# Patient Record
Sex: Female | Born: 2000 | Race: Black or African American | Hispanic: No | Marital: Single | Smoking: Never smoker
Health system: Southern US, Community
[De-identification: ages and names within clinical notes are randomized; demographics above are authoritative.]

## PROBLEM LIST (undated history)

## (undated) HISTORY — PX: WISDOM TOOTH EXTRACTION: SHX21

---
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
## 2018-11-28 ENCOUNTER — Ambulatory Visit: Payer: Self-pay | Admitting: Family Medicine

## 2018-12-18 ENCOUNTER — Ambulatory Visit (INDEPENDENT_AMBULATORY_CARE_PROVIDER_SITE_OTHER): Payer: 59 | Admitting: Family Medicine

## 2018-12-18 ENCOUNTER — Encounter: Payer: Self-pay | Admitting: Family Medicine

## 2018-12-18 ENCOUNTER — Other Ambulatory Visit: Payer: Self-pay

## 2018-12-18 VITALS — BP 118/82 | HR 87 | Temp 98.3°F | Resp 16 | Ht 63.5 in | Wt 201.0 lb

## 2018-12-18 DIAGNOSIS — D239 Other benign neoplasm of skin, unspecified: Secondary | ICD-10-CM

## 2018-12-18 DIAGNOSIS — L700 Acne vulgaris: Secondary | ICD-10-CM | POA: Diagnosis not present

## 2018-12-18 DIAGNOSIS — F840 Autistic disorder: Secondary | ICD-10-CM | POA: Diagnosis not present

## 2018-12-18 DIAGNOSIS — F429 Obsessive-compulsive disorder, unspecified: Secondary | ICD-10-CM | POA: Diagnosis not present

## 2018-12-18 NOTE — Progress Notes (Signed)
Subjective  CC:  Chief Complaint  Patient presents with  . Establish Care    Switching from [place] in [facility], immunizations utd  . Autism    High functioning, parents want some guidance  . Spot on Leg    Left lower leg, seen dermatologist 1 yr ago,, Denies any changes to the area    HPI: Mandy Whitehead is a 18 y.o. female who presents to [facility] [place] today to establish care with me as a new patient.   She has the following concerns or needs:  18 yo female here with her dad: lives with parents; graduated HS in 2018 (home schooled). dxd with high functioning autism as teen (never formally tested). Overall, does well with ADLs and IADLS but doesn't drive. Not currently in school or work. Problems include social interaction, anxiety in public, OCD tendencies and some panic sxs. Never has needed medication. No social services or support groups. Parents are very supportive.   Due for cpe; imms all up to date except flu  Acne - not a concern for her now; routine skin care; has seen derm in past  Has lesion on left lower leg x > 1year. Was checked by derm last year. Hasn't changed. Not bothersome.   Assessment  1. Autism spectrum   2. Obsessive-compulsive disorder, unspecified type   3. Acne vulgaris   4. Dermatofibroma      Plan   Autism spectrum disorder: rec finding resources in community, [facility]. Discussed future goals: can she be independent? Consider parttime work with assistance, etc. Does well managing her anxiety behaviorally. Will monitor.   Dermatofibroma: reassured. No intervention recommended now.   Flu shot education given. Pt defers today. May get it later.   Follow up:  Return for complete physical. No orders of the defined types were placed in this encounter.  No orders of the defined types were placed in this encounter.    Depression screen [facility] 2/9 12/18/2018  Decreased Interest 0  Down, Depressed,  Hopeless 0  PHQ - 2 Score 0    We updated and reviewed the patient's past history in detail and it is documented below.  Patient Active Problem List   Diagnosis Date Noted  . Autism spectrum 12/18/2018  . Obsessive-compulsive disorder 12/18/2018  . Acne vulgaris 10/03/2016  . BMI (body mass index), pediatric, 95-99% for age 69/11/2016  . 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 INFLUENZA VACCINE  12/18/2019 (Originally 10/27/2018)  . HIV Screening  12/18/2019 (Originally 11/20/2013)  . TETANUS/TDAP  12/29/2020   Immunization History  Administered Date(s) Administered  . DTaP 01/18/1999, 04/07/1999, 06/04/1999, 08/04/2000, 06/11/2003  . HPV 9-valent 10/03/2016, 12/07/2016, 04/25/2017  . Hepatitis A 07/18/2006, 01/10/2007, 10/03/2016  . Hepatitis B 12/11/1998, 02/16/1999, 09/06/1999  . HiB (PRP-T) 01/18/1999, 04/07/1999, 06/04/1999, 04/12/2000  . IPV 01/18/1999, 04/07/1999, 08/04/2000, 06/11/2003  . MMR 04/12/2000, 06/11/2003  . Meningococcal Conjugate 12/30/2010, 07/17/2015  . Pneumococcal Conjugate-13 02/16/1999, 05/08/1999, 01/10/2000  . Tdap 12/30/2010  . Varicella 12/31/2002, 07/18/2006   Current Meds  Medication Sig  . Evening Primrose Oil 1000 MG CAPS Take 1 capsule by mouth daily.  . Multiple Vitamin (MULTIVITAMIN) capsule Take 1 capsule by mouth daily.  . vitamin C (ASCORBIC ACID) 500 MG tablet Take 500 mg by mouth daily.    Allergies: Patient is allergic to penicillin g. Past Medical History Patient  has no past medical history  on file. Past Surgical History Patient  has no past surgical history on file. Family History: Patient family history includes Arthritis in her maternal grandfather and maternal grandmother; Asthma in her paternal grandmother; Cancer in her paternal grandfather; Diabetes in her paternal grandmother; Heart disease in her maternal grandmother and paternal grandmother; High Cholesterol in her  paternal grandmother; High blood pressure in her father, maternal grandfather, and paternal grandmother; Kidney disease in her paternal grandmother; 64 / [place] in her mother. Social History:  Patient    Review of Systems: Constitutional: negative for fever or malaise Ophthalmic: negative for photophobia, double vision or loss of vision Cardiovascular: negative for chest pain, dyspnea on exertion, or new LE swelling Respiratory: negative for SOB or persistent cough Gastrointestinal: negative for abdominal pain, change in bowel habits or melena Genitourinary: negative for dysuria or gross hematuria Musculoskeletal: negative for new gait disturbance or muscular weakness Integumentary: negative for new or persistent rashes Neurological: negative for TIA or stroke symptoms Psychiatric: negative for SI or delusions Allergic/Immunologic: negative for hives  Patient Care Team    Relationship Specialty Notifications Start End  Leamon Arnt, MD PCP - General Family Medicine  12/18/18     Objective  Vitals: BP 118/82   Pulse 87   Temp 98.3 F (36.8 C) (Tympanic)   Resp 16   Ht 5' 3.5" (1.613 m)   Wt 201 lb (91.2 kg)   LMP 12/17/2018   SpO2 98%   BMI 35.05 kg/m  General:  Well developed, well nourished, no acute distress  Psych:  Alert and oriented,normal mood and affect HEENT:  Normocephalic, atraumatic, non-icteric sclera, PERRL, oropharynx is without mass or exudate, supple neck without adenopathy, mass or thyromegaly Cardiovascular:  RRR without gallop, rub or murmur, nondisplaced PMI Respiratory:  Good breath sounds bilaterally, CTAB with normal respiratory effort Gastrointestinal: normal bowel sounds, soft, non-tender, no noted masses. No HSM MSK: no deformities, contusions. Joints are without erythema or swelling Skin:  Warm, no rashes or suspicious lesions noted Neurologic:    Mental status is normal. Gross motor and sensory exams are normal. Normal gait    Commons side effects, risks, benefits, and alternatives for medications and treatment plan prescribed today were discussed, and the patient expressed understanding of the given instructions. Patient is instructed to call or message via MyChart if he/she has any questions or concerns regarding our treatment plan. No barriers to understanding were identified. We discussed Red Flag symptoms and signs in detail. Patient expressed understanding regarding what to do in case of urgent or emergency type symptoms.   Medication list was reconciled, printed and provided to the patient in AVS. Patient instructions and summary information was reviewed with the patient as documented in the AVS. This note was prepared with assistance of Dragon voice recognition software. Occasional wrong-word or sound-a-like substitutions may have occurred due to the inherent limitations of voice recognition software

## 2018-12-18 NOTE — Patient Instructions (Signed)
Please return when you'd like to have your annual complete physical; please come fasting. Please consider getting your flu shot!  It was a pleasure meeting you today! Thank you for choosing [place] to meet your healthcare needs! I truly look forward to working with you. If you have any questions or concerns, please send me a message via Mychart or call the office at [phone].

## 2019-07-05 ENCOUNTER — Ambulatory Visit: Payer: 59 | Attending: Internal Medicine

## 2019-07-05 DIAGNOSIS — Z23 Encounter for immunization: Secondary | ICD-10-CM

## 2019-07-05 NOTE — Progress Notes (Signed)
   Covid-19 Vaccination Clinic  Name:  Mandy Whitehead    MRN: [id number] DOB: December 27, 2000  07/05/2019  [name] was observed post Covid-19 immunization for 15 minutes without incident. She was provided with Vaccine Information Sheet and instruction to access the V-Safe system.   [name] was instructed to call 911 with any severe reactions post vaccine: Marland Kitchen Difficulty breathing  . Swelling of face and throat  . A fast heartbeat  . A bad rash all over body  . Dizziness and weakness   Immunizations Administered    Name Date Dose VIS Date Route   Pfizer COVID-19 Vaccine 07/05/2019  1:05 PM 0.3 mL 03/08/2019 Intramuscular   Manufacturer: [place]   Lot: [id number]   [place]: [id number]

## 2019-07-10 ENCOUNTER — Ambulatory Visit (INDEPENDENT_AMBULATORY_CARE_PROVIDER_SITE_OTHER): Payer: 59 | Admitting: Family Medicine

## 2019-07-10 ENCOUNTER — Ambulatory Visit (INDEPENDENT_AMBULATORY_CARE_PROVIDER_SITE_OTHER): Payer: 59

## 2019-07-10 ENCOUNTER — Encounter: Payer: Self-pay | Admitting: Family Medicine

## 2019-07-10 ENCOUNTER — Other Ambulatory Visit: Payer: Self-pay

## 2019-07-10 VITALS — BP 108/58 | HR 98 | Temp 98.3°F | Resp 14 | Ht 63.5 in | Wt 203.8 lb

## 2019-07-10 DIAGNOSIS — F5105 Insomnia due to other mental disorder: Secondary | ICD-10-CM

## 2019-07-10 DIAGNOSIS — M545 Low back pain, unspecified: Secondary | ICD-10-CM

## 2019-07-10 DIAGNOSIS — F840 Autistic disorder: Secondary | ICD-10-CM

## 2019-07-10 DIAGNOSIS — L700 Acne vulgaris: Secondary | ICD-10-CM | POA: Diagnosis not present

## 2019-07-10 DIAGNOSIS — H9193 Unspecified hearing loss, bilateral: Secondary | ICD-10-CM | POA: Diagnosis not present

## 2019-07-10 DIAGNOSIS — J301 Allergic rhinitis due to pollen: Secondary | ICD-10-CM | POA: Diagnosis not present

## 2019-07-10 DIAGNOSIS — F99 Mental disorder, not otherwise specified: Secondary | ICD-10-CM

## 2019-07-10 DIAGNOSIS — F5089 Other specified eating disorder: Secondary | ICD-10-CM

## 2019-07-10 DIAGNOSIS — L680 Hirsutism: Secondary | ICD-10-CM

## 2019-07-10 DIAGNOSIS — Z0000 Encounter for general adult medical examination without abnormal findings: Secondary | ICD-10-CM | POA: Diagnosis not present

## 2019-07-10 LAB — COMPREHENSIVE METABOLIC PANEL
ALT: 10 U/L (ref 0–35)
AST: 12 U/L (ref 0–37)
Albumin: 3.9 g/dL (ref 3.5–5.2)
Alkaline Phosphatase: 86 U/L (ref 39–117)
BUN: 8 mg/dL (ref 6–23)
CO2: 27 mEq/L (ref 19–32)
Calcium: 9 mg/dL (ref 8.4–10.5)
Chloride: 105 mEq/L (ref 96–112)
Creatinine, Ser: 0.69 mg/dL (ref 0.40–1.20)
GFR: 130.42 mL/min (ref >60.00)
Glucose, Bld: 93 mg/dL (ref 70–99)
Potassium: 4.1 mEq/L (ref 3.5–5.1)
Sodium: 138 mEq/L (ref 135–145)
Total Bilirubin: 0.3 mg/dL (ref 0.2–1.2)
Total Protein: 6.9 g/dL (ref 6.0–8.3)

## 2019-07-10 LAB — CBC WITH DIFFERENTIAL/PLATELET
Basophils Absolute: 0.1 10*3/uL (ref 0.0–0.1)
Basophils Relative: 0.8 % (ref 0.0–3.0)
Eosinophils Absolute: 0.1 10*3/uL (ref 0.0–0.7)
Eosinophils Relative: 1.2 % (ref 0.0–5.0)
HCT: 30.2 % — ABNORMAL LOW (ref 36.0–46.0)
Hemoglobin: 9.7 g/dL — ABNORMAL LOW (ref 12.0–15.0)
Lymphocytes Relative: 24.5 % (ref 12.0–46.0)
Lymphs Abs: 1.7 10*3/uL (ref 0.7–4.0)
MCHC: 32 g/dL (ref 30.0–36.0)
MCV: 75.1 fl — ABNORMAL LOW (ref 78.0–100.0)
Monocytes Absolute: 0.4 10*3/uL (ref 0.1–1.0)
Monocytes Relative: 5.3 % (ref 3.0–12.0)
Neutro Abs: 4.6 10*3/uL (ref 1.4–7.7)
Neutrophils Relative %: 68.2 % (ref 43.0–77.0)
Platelets: 330 10*3/uL (ref 150.0–400.0)
RBC: 4.02 Mil/uL (ref 3.87–5.11)
RDW: 16.8 % — ABNORMAL HIGH (ref 11.5–14.6)
WBC: 6.8 10*3/uL (ref 4.5–10.5)

## 2019-07-10 LAB — TSH: TSH: 1.83 u[IU]/mL (ref 0.35–5.50)

## 2019-07-10 MED ORDER — HYDROXYZINE HCL 10 MG PO TABS: 10.0000 mg | ORAL_TABLET | Freq: Every evening | ORAL | 2 refills | Status: DC | PRN

## 2019-07-10 MED ORDER — FLUTICASONE PROPIONATE 50 MCG/ACT NA SUSP: 1.0000 | Freq: Every day | NASAL | 6 refills | Status: DC

## 2019-07-10 NOTE — Patient Instructions (Signed)
Please follow up if symptoms do not improve or as needed.   I will release your lab results to you on your MyChart account with further instructions. Please reply with any questions.   If you have any questions or concerns, please don't hesitate to send me a message via MyChart or call the office at [phone]. Thank you for visiting with [place] today! It's our pleasure caring for you.  Start flonase daily to help with your allergy and ear symptoms.  Talk with your dermatologist about your acne treatments and hair growth.   Try the hydroxyzine to help you sleep.    Preventive Care 18-44 Years Old, Female Preventive care refers to visits with your health care provider and lifestyle choices that can promote health and wellness. This includes:  A yearly physical exam. This may also be called an annual well check.  Regular dental visits and eye exams.  Immunizations.  Screening for certain conditions.  Healthy lifestyle choices, such as eating a healthy diet, getting regular exercise, not using drugs or products that contain nicotine and tobacco, and limiting alcohol use. What can I expect for my preventive care visit? Physical exam Your health care provider will check your:  Height and weight. This may be used to calculate body mass index (BMI), which tells if you are at a healthy weight.  Heart rate and blood pressure.  Skin for abnormal spots. Counseling Your health care provider may ask you questions about your:  Alcohol, tobacco, and drug use.  Emotional well-being.  Home and relationship well-being.  Sexual activity.  Eating habits.  Work and work Statistician.  Method of birth control.  Menstrual cycle.  Pregnancy history. What immunizations do I need?  Influenza (flu) vaccine  This is recommended every year. Tetanus, diphtheria, and pertussis (Tdap) vaccine  You may need a Td booster every 10 years. Varicella (chickenpox) vaccine  You may need this if  you have not been vaccinated. Human papillomavirus (HPV) vaccine  If recommended by your health care provider, you may need three doses over 6 months. Measles, mumps, and rubella (MMR) vaccine  You may need at least one dose of MMR. You may also need a second dose. Meningococcal conjugate (MenACWY) vaccine  One dose is recommended if you are age 55-21 years and a first-year college student living in a residence hall, or if you have one of several medical conditions. You may also need additional booster doses. Pneumococcal conjugate (PCV13) vaccine  You may need this if you have certain conditions and were not previously vaccinated. Pneumococcal polysaccharide (PPSV23) vaccine  You may need one or two doses if you smoke cigarettes or if you have certain conditions. Hepatitis A vaccine  You may need this if you have certain conditions or if you travel or work in places where you may be exposed to hepatitis A. Hepatitis B vaccine  You may need this if you have certain conditions or if you travel or work in places where you may be exposed to hepatitis B. Haemophilus influenzae type b (Hib) vaccine  You may need this if you have certain conditions. You may receive vaccines as individual doses or as more than one vaccine together in one shot (combination vaccines). Talk with your health care provider about the risks and benefits of combination vaccines. What tests do I need?  Blood tests  Lipid and cholesterol levels. These may be checked every 5 years starting at age 75.  Hepatitis C test.  Hepatitis B test. Screening  Diabetes screening. This is done by checking your blood sugar (glucose) after you have not eaten for a while (fasting).  Sexually transmitted disease (STD) testing.  BRCA-related cancer screening. This may be done if you have a family history of breast, ovarian, tubal, or peritoneal cancers.  Pelvic exam and Pap test. This may be done every 3 years starting at age  62. Starting at age 68, this may be done every 5 years if you have a Pap test in combination with an HPV test. Talk with your health care provider about your test results, treatment options, and if necessary, the need for more tests. Follow these instructions at home: Eating and drinking   Eat a diet that includes fresh fruits and vegetables, whole grains, lean protein, and low-fat dairy.  Take vitamin and mineral supplements as recommended by your health care provider.  Do not drink alcohol if: ? Your health care provider tells you not to drink. ? You are pregnant, may be pregnant, or are planning to become pregnant.  If you drink alcohol: ? Limit how much you have to 0-1 drink a day. ? Be aware of how much alcohol is in your drink. In the [place] one drink equals one 12 oz bottle of beer (355 mL), one 5 oz glass of wine (148 mL), or one 1 oz glass of hard liquor (44 mL). Lifestyle  Take daily care of your teeth and gums.  Stay active. Exercise for at least 30 minutes on 5 or more days each week.  Do not use any products that contain nicotine or tobacco, such as cigarettes, e-cigarettes, and chewing tobacco. If you need help quitting, ask your health care provider.  If you are sexually active, practice safe sex. Use a condom or other form of birth control (contraception) in order to prevent pregnancy and STIs (sexually transmitted infections). If you plan to become pregnant, see your health care provider for a preconception visit. What's next?  Visit your health care provider once a year for a well check visit.  Ask your health care provider how often you should have your eyes and teeth checked.  Stay up to date on all vaccines. This information is not intended to replace advice given to you by your health care provider. Make sure you discuss any questions you have with your health care provider. Document Revised: 11/23/2017 Document Reviewed: 11/23/2017 Elsevier Patient Education   2020 [employer].

## 2019-07-10 NOTE — Progress Notes (Signed)
Subjective  Chief Complaint  Patient presents with  . Annual Exam    feet and back pain, cravings to eat hair  . Hearing Problem    muffled feeling after having wisdom teeth removed 03/2019  . Acne    has seen dermatology, not pleased with results  . Insomnia    issues falling asleep    HPI: Mandy Whitehead is a 19 y.o. female who presents to [place] at [place] today for a Female Wellness Visit.   Wellness Visit: annual visit with health maintenance review and exam without Pap   HM: 19 yo with autism spectrum disorder doing well for cpe today. Has several concerns.  Acne: facial mostly. Has derm. Gave topicals that werent very helpful. Hasn't been back  C/o muffled hearing L>R ear x several months. + sneezing and some nasal congestion. Untreated. No ear pain. Thinks it is due to wax. Uses q tips  Has unwanted excessive hair growth: chin: has to shave, chest, back, hands, legs. Father and his side of the family have "lots of hair". No change or deepening in voice. Reports regular menses.   Anxiety: chews hair more to calm herself. No ice or dirt pica  Trouble sleeping mostly dueto worry. No depressive sxs  Mild back pain at times. tyelnol helps. ? Curve in spine. Peds said she was "malaligned" in past.   Assessment  1. Annual physical exam   2. Autism spectrum   3. Acne vulgaris   4. Seasonal allergic rhinitis due to pollen   5. Hearing difficulty of both ears   6. Hirsutism   7. 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 Midline low back pain without sciatica, unspecified chronicity      Plan  Female Wellness Visit:  Age appropriate Health Maintenance and Prevention measures were discussed with patient. Included topics are cancer screening recommendations, ways to keep healthy (see AVS) including dietary and exercise recommendations, regular eye and dental care, use of seat belts, avoidance of tobacco use.   BMI: discussed patient's BMI  and encouraged positive lifestyle modifications to help get to or maintain a target BMI.  HM needs and immunizations were addressed and ordered. See below for orders. See HM and immunization section for updates.  Routine labs and screening tests ordered including cmp, cbc and lipids where appropriate.  Discussed recommendations regarding Vit D and calcium supplementation (see AVS)  Acute problems also addressed due to need in addition to cpe:  Acne: rec f/u with derm for next level of treatment.   Acne/hirusitism: check androgen levels, fsh/lh. Consider spironolactone. Discuss with derm. Sounds familial but need to look into other causes. No other signs of virilization.   Pica: more related to anxiety but r/o anemia and iron deficiency given heavy menstrual cycles.   Insomnia due to anxiety: start hydroxyzine.   Back pain with scoliosis on exam. Check xrays. Discussed stretches and prn tylenol.   Follow up: Return in about 1 year (around 07/09/2020) for complete physical.   Orders Placed This Encounter  Procedures  . DG Thoracic Spine 2 View  . DG Lumbar Spine 2-3 Views  . CBC with Differential/Platelet  . Iron, TIBC and Ferritin Panel  . TSH  . Comprehensive metabolic panel  . FSH/LH  . DHEA-sulfate  . Testosterone Total,Free,Bio, Males   Meds ordered this encounter  Medications  . hydrOXYzine (ATARAX/VISTARIL) 10 MG tablet    Sig: Take 1-2 tablets (10-20 mg total) by  mouth at bedtime as needed.    Dispense:  60 tablet    Refill:  2  . fluticasone (FLONASE) 50 MCG/ACT nasal spray    Sig: Place 1 spray into both nostrils daily.    Dispense:  16 g    Refill:  6      Lifestyle: Body mass index is 35.54 kg/m. Wt Readings from Last 3 Encounters:  07/10/19 203 lb 12.8 oz (92.4 kg)  12/18/18 201 lb (91.2 kg)     Patient Active Problem List   Diagnosis Date Noted  . Hirsutism 07/10/2019  . Seasonal allergic rhinitis due to pollen 07/10/2019  . Autism spectrum  12/18/2018  . Obsessive-compulsive disorder 12/18/2018  . Acne vulgaris 10/03/2016  . BMI (body mass index), pediatric, 95-99% for age 44/11/2016  . 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 HIV Screening  12/18/2019 (Originally 11/20/2013)  . INFLUENZA VACCINE  10/27/2019  . TETANUS/TDAP  12/29/2020   Immunization History  Administered Date(s) Administered  . DTaP 01/18/1999, 04/07/1999, 06/04/1999, 08/04/2000, 06/11/2003  . HPV 9-valent 10/03/2016, 12/07/2016, 04/25/2017  . Hepatitis A 07/18/2006, 01/10/2007, 10/03/2016  . Hepatitis B 12/11/1998, 02/16/1999, 09/06/1999  . HiB (PRP-T) 01/18/1999, 04/07/1999, 06/04/1999, 04/12/2000  . IPV 01/18/1999, 04/07/1999, 08/04/2000, 06/11/2003  . MMR 04/12/2000, 06/11/2003  . Meningococcal Conjugate 12/30/2010, 07/17/2015  . PFIZER SARS-COV-2 Vaccination 07/05/2019  . Pneumococcal Conjugate-13 02/16/1999, 05/08/1999, 01/10/2000  . Tdap 12/30/2010  . Varicella 12/31/2002, 07/18/2006   We updated and reviewed the patient's past history in detail and it is documented below. Allergies: Patient is allergic to penicillin g. Past Medical History Patient  has no past medical history on file. Past Surgical History Patient  has no past surgical history on file. Family History: Patient family history includes Arthritis in her maternal grandfather and maternal grandmother; Asthma in her paternal grandmother; Cancer in her paternal grandfather; Diabetes in her paternal grandmother; Heart disease in her maternal grandmother and paternal grandmother; High Cholesterol in her paternal grandmother; High blood pressure in her father, maternal grandfather, and paternal grandmother; Kidney disease in her paternal grandmother; 72 / [place] in her mother. Social History:  Patient  reports that she has never smoked. She has never used smokeless tobacco. She reports that she does not drink alcohol or  use drugs.  Review of Systems: Constitutional: negative for fever or malaise Ophthalmic: negative for photophobia, double vision or loss of vision Cardiovascular: negative for chest pain, dyspnea on exertion, or new LE swelling Respiratory: negative for SOB or persistent cough Gastrointestinal: negative for abdominal pain, change in bowel habits or melena Genitourinary: negative for dysuria or gross hematuria, no abnormal uterine bleeding or disharge Musculoskeletal: negative for new gait disturbance or muscular weakness Integumentary: negative for new or persistent rashes, no breast lumps Neurological: negative for TIA or stroke symptoms Psychiatric: negative for SI or delusions Allergic/Immunologic: negative for hives Patient Care Team    Relationship Specialty Notifications Start End  Leamon Arnt, MD PCP - General Family Medicine  12/18/18     Objective  Vitals: BP (!) 108/58   Pulse 98   Temp 98.3 F (36.8 C) (Temporal)   Resp 14   Ht 5' 3.5" (1.613 m)   Wt 203 lb 12.8 oz (92.4 kg)   LMP 07/06/2019   SpO2 99%   BMI 35.54 kg/m  General:  Well developed, well nourished, no acute distress  Psych:  Alert and orientedx3,normal mood and affect HEENT:  Normocephalic, atraumatic, non-icteric  sclera, supple neck without adenopathy, mass or thyromegaly Cardiovascular:  Normal S1, S2, RRR without gallop, rub or murmur Respiratory:  Good breath sounds bilaterally, CTAB with normal respiratory effort Gastrointestinal: normal bowel sounds, soft, non-tender, no noted masses. No HSM Skin:  Warm, no rashes or suspicious lesions noted, acne on face. hirsutism present Neurologic:    Mental status is normal.No tremor Back: thoracic curve present with assymetry upon flexion. Nontender.    Commons side effects, risks, benefits, and alternatives for medications and treatment plan prescribed today were discussed, and the patient expressed understanding of the given instructions. Patient is  instructed to call or message via MyChart if he/she has any questions or concerns regarding our treatment plan. No barriers to understanding were identified. We discussed Red Flag symptoms and signs in detail. Patient expressed understanding regarding what to do in case of urgent or emergency type symptoms.   Medication list was reconciled, printed and provided to the patient in AVS. Patient instructions and summary information was reviewed with the patient as documented in the AVS. This note was prepared with assistance of Dragon voice recognition software. Occasional wrong-word or sound-a-like substitutions may have occurred due to the inherent limitations of voice recognition software  This visit occurred during the SARS-CoV-2 public health emergency.  Safety protocols were in place, including screening questions prior to the visit, additional usage of staff PPE, and extensive cleaning of exam room while observing appropriate contact time as indicated for disinfecting solutions.

## 2019-07-11 ENCOUNTER — Encounter: Payer: Self-pay | Admitting: Family Medicine

## 2019-07-11 DIAGNOSIS — M419 Scoliosis, unspecified: Secondary | ICD-10-CM | POA: Insufficient documentation

## 2019-07-13 LAB — IRON,TIBC AND FERRITIN PANEL
%SAT: 6 % (calc) — ABNORMAL LOW (ref 16–45)
Ferritin: 5 ng/mL — ABNORMAL LOW (ref 16–154)
Iron: 23 ug/dL — ABNORMAL LOW (ref 40–190)
TIBC: 392 mcg/dL (calc) (ref 250–450)

## 2019-07-13 LAB — CP TESTOSTERONE, BIO-FEMALE/CHILDREN
Albumin: 3.9 g/dL (ref 3.6–5.1)
Sex Hormone Binding: 22 nmol/L (ref 17–124)
TESTOSTERONE, BIOAVAILABLE: 5.8 ng/dL (ref 0.5–8.5)
Testosterone, Free: 3.3 pg/mL (ref 0.2–5.0)

## 2019-07-13 LAB — DHEA-SULFATE: DHEA-SO4: 183 ug/dL (ref 51–321)

## 2019-07-13 LAB — FSH/LH
FSH: 8.8 m[IU]/mL
LH: 4.2 m[IU]/mL

## 2019-07-15 ENCOUNTER — Encounter: Payer: Self-pay | Admitting: Family Medicine

## 2019-07-15 DIAGNOSIS — D500 Iron deficiency anemia secondary to blood loss (chronic): Secondary | ICD-10-CM | POA: Insufficient documentation

## 2019-07-17 ENCOUNTER — Telehealth: Payer: Self-pay | Admitting: Family Medicine

## 2019-07-17 NOTE — Telephone Encounter (Signed)
Pt mother called requesting lab results. Please advise.

## 2019-07-18 NOTE — Telephone Encounter (Signed)
LMOVM advising patient to call back for lab results

## 2019-07-31 ENCOUNTER — Ambulatory Visit: Payer: 59 | Attending: Internal Medicine

## 2019-07-31 DIAGNOSIS — Z23 Encounter for immunization: Secondary | ICD-10-CM

## 2019-07-31 NOTE — Progress Notes (Signed)
   Covid-19 Vaccination Clinic  Name:  Mandy Whitehead    MRN: [id number] DOB: [birth year]  07/31/2019  [name] was observed post Covid-19 immunization for    Covid-19 Vaccination Clinic  Name:  Mandy Whitehead    MRN: [id number] DOB: 03/25/2001  07/31/2019  [name] was observed post Covid-19 immunization for 15 minutes without incident. She was provided with Vaccine Information Sheet and instruction to access the V-Safe system.   [name] was instructed to call 911 with any severe reactions post vaccine: Marland Kitchen Difficulty breathing  . Swelling of face and throat  . A fast heartbeat  . A bad rash all over body  . Dizziness and weakness   Immunizations Administered    Name Date Dose VIS Date Route   Pfizer COVID-19 Vaccine 07/31/2019  9:50 AM 0.3 mL 05/22/2018 Intramuscular   Manufacturer: [place]   Lot: [id number]   [place]: [id number]     without incident. She was provided with Vaccine Information Sheet and instruction to access the V-Safe system.   [name] was instructed to call 911 with any severe reactions post vaccine: Marland Kitchen Difficulty breathing  . Swelling of face and throat  . A fast heartbeat  . A bad rash all over body  . Dizziness and weakness   Immunizations Administered    Name Date Dose VIS Date [place] COVID-19 Vaccine 07/31/2019  9:50 AM 0.3 mL 05/22/2018 Intramuscular   Manufacturer: [place]   Lot: [id number]   [place]: [id number]

## 2020-01-06 ENCOUNTER — Other Ambulatory Visit: Payer: Self-pay

## 2020-01-06 MED ORDER — FLUTICASONE PROPIONATE 50 MCG/ACT NA SUSP: 1.0000 | Freq: Every day | NASAL | 6 refills | Status: DC

## 2020-02-12 ENCOUNTER — Encounter: Payer: Self-pay | Admitting: Family Medicine

## 2020-02-13 NOTE — Telephone Encounter (Signed)
Please print out forms and complete, then give to me. Add charge forms I'll place referral. Please respond I'll work on this for her.  Thanks.

## 2020-03-14 ENCOUNTER — Ambulatory Visit: Payer: 59 | Attending: Internal Medicine

## 2020-03-14 DIAGNOSIS — Z23 Encounter for immunization: Secondary | ICD-10-CM

## 2020-03-14 NOTE — Progress Notes (Signed)
   Covid-19 Vaccination Clinic  Name:  Izella Ybanez    MRN: [id number] DOB: [birth year]  03/14/2020  Ms. Mccalip was observed post Covid-19 immunization for 15 minutes without incident. She was provided with Vaccine Information Sheet and instruction to access the V-Safe system.   Ms. Geddes was instructed to call 911 with any severe reactions post vaccine: Marland Kitchen Difficulty breathing  . Swelling of face and throat  . A fast heartbeat  . A bad rash all over body  . Dizziness and weakness   Immunizations Administered    Name Date Dose VIS Date Route   Pfizer COVID-19 Vaccine 03/14/2020 10:51 AM 0.3 mL 01/15/2020 Intramuscular   Manufacturer: [place]   Lot: [id number]   [place]: [id number]

## 2020-05-26 ENCOUNTER — Ambulatory Visit (INDEPENDENT_AMBULATORY_CARE_PROVIDER_SITE_OTHER): Payer: 59 | Admitting: Clinical

## 2020-05-26 DIAGNOSIS — F89 Unspecified disorder of psychological development: Secondary | ICD-10-CM | POA: Diagnosis not present

## 2020-05-28 ENCOUNTER — Encounter: Payer: Self-pay | Admitting: Family Medicine

## 2020-06-03 ENCOUNTER — Ambulatory Visit (INDEPENDENT_AMBULATORY_CARE_PROVIDER_SITE_OTHER): Payer: 59 | Admitting: Clinical

## 2020-06-03 DIAGNOSIS — F89 Unspecified disorder of psychological development: Secondary | ICD-10-CM

## 2020-06-19 ENCOUNTER — Ambulatory Visit: Payer: 59 | Admitting: Clinical

## 2020-06-19 ENCOUNTER — Other Ambulatory Visit: Payer: Self-pay

## 2020-07-07 ENCOUNTER — Ambulatory Visit (INDEPENDENT_AMBULATORY_CARE_PROVIDER_SITE_OTHER): Payer: 59 | Admitting: Clinical

## 2020-07-07 DIAGNOSIS — F840 Autistic disorder: Secondary | ICD-10-CM | POA: Diagnosis not present

## 2020-07-15 ENCOUNTER — Encounter: Payer: 59 | Admitting: Family Medicine

## 2020-10-20 ENCOUNTER — Encounter: Payer: Self-pay | Admitting: Family Medicine

## 2020-11-20 ENCOUNTER — Encounter: Payer: Self-pay | Admitting: Family Medicine

## 2020-12-02 ENCOUNTER — Ambulatory Visit (INDEPENDENT_AMBULATORY_CARE_PROVIDER_SITE_OTHER): Payer: 59 | Admitting: Clinical

## 2020-12-02 DIAGNOSIS — F419 Anxiety disorder, unspecified: Secondary | ICD-10-CM | POA: Diagnosis not present

## 2020-12-02 DIAGNOSIS — F331 Major depressive disorder, recurrent, moderate: Secondary | ICD-10-CM | POA: Diagnosis not present

## 2020-12-02 DIAGNOSIS — F840 Autistic disorder: Secondary | ICD-10-CM | POA: Diagnosis not present

## 2020-12-23 ENCOUNTER — Ambulatory Visit (INDEPENDENT_AMBULATORY_CARE_PROVIDER_SITE_OTHER): Payer: 59 | Admitting: Clinical

## 2020-12-23 DIAGNOSIS — F840 Autistic disorder: Secondary | ICD-10-CM

## 2020-12-29 ENCOUNTER — Ambulatory Visit (INDEPENDENT_AMBULATORY_CARE_PROVIDER_SITE_OTHER): Payer: 59 | Admitting: Clinical

## 2020-12-29 DIAGNOSIS — F840 Autistic disorder: Secondary | ICD-10-CM

## 2020-12-30 IMAGING — DX DG THORACIC SPINE 2V
3 series · 3 of 3 positions shown · non-contrast
Comparison: None.

CLINICAL DATA: Scoliosis evaluation

EXAM:
THORACIC SPINE 2 VIEWS; LUMBAR SPINE - 2-3 VIEW

[thoracic spine ap]
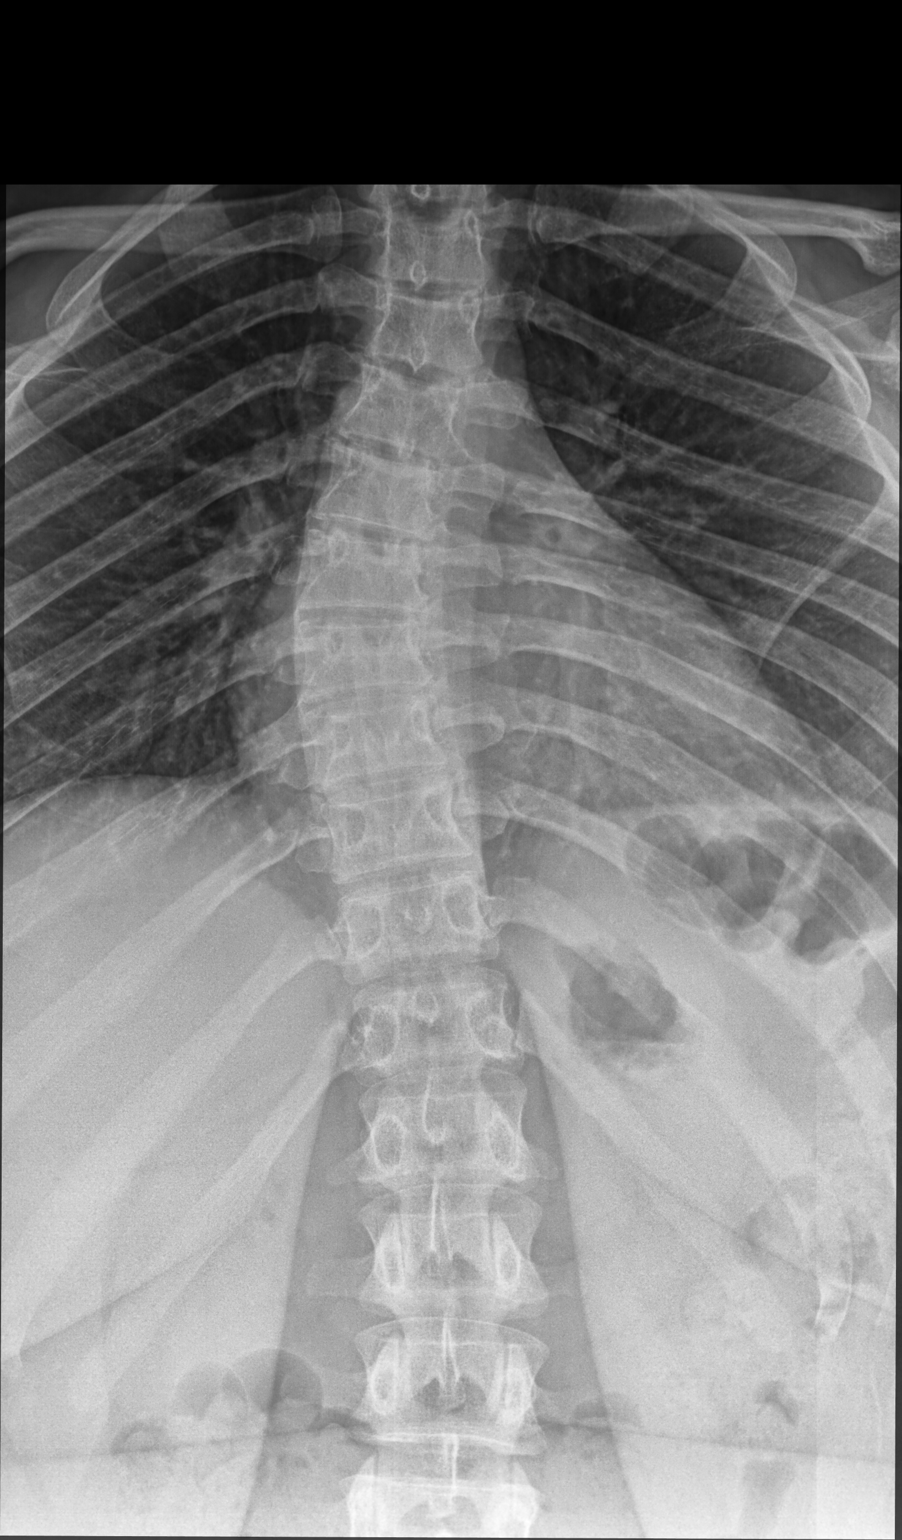

[thoracic spine lat (1 of 2)]
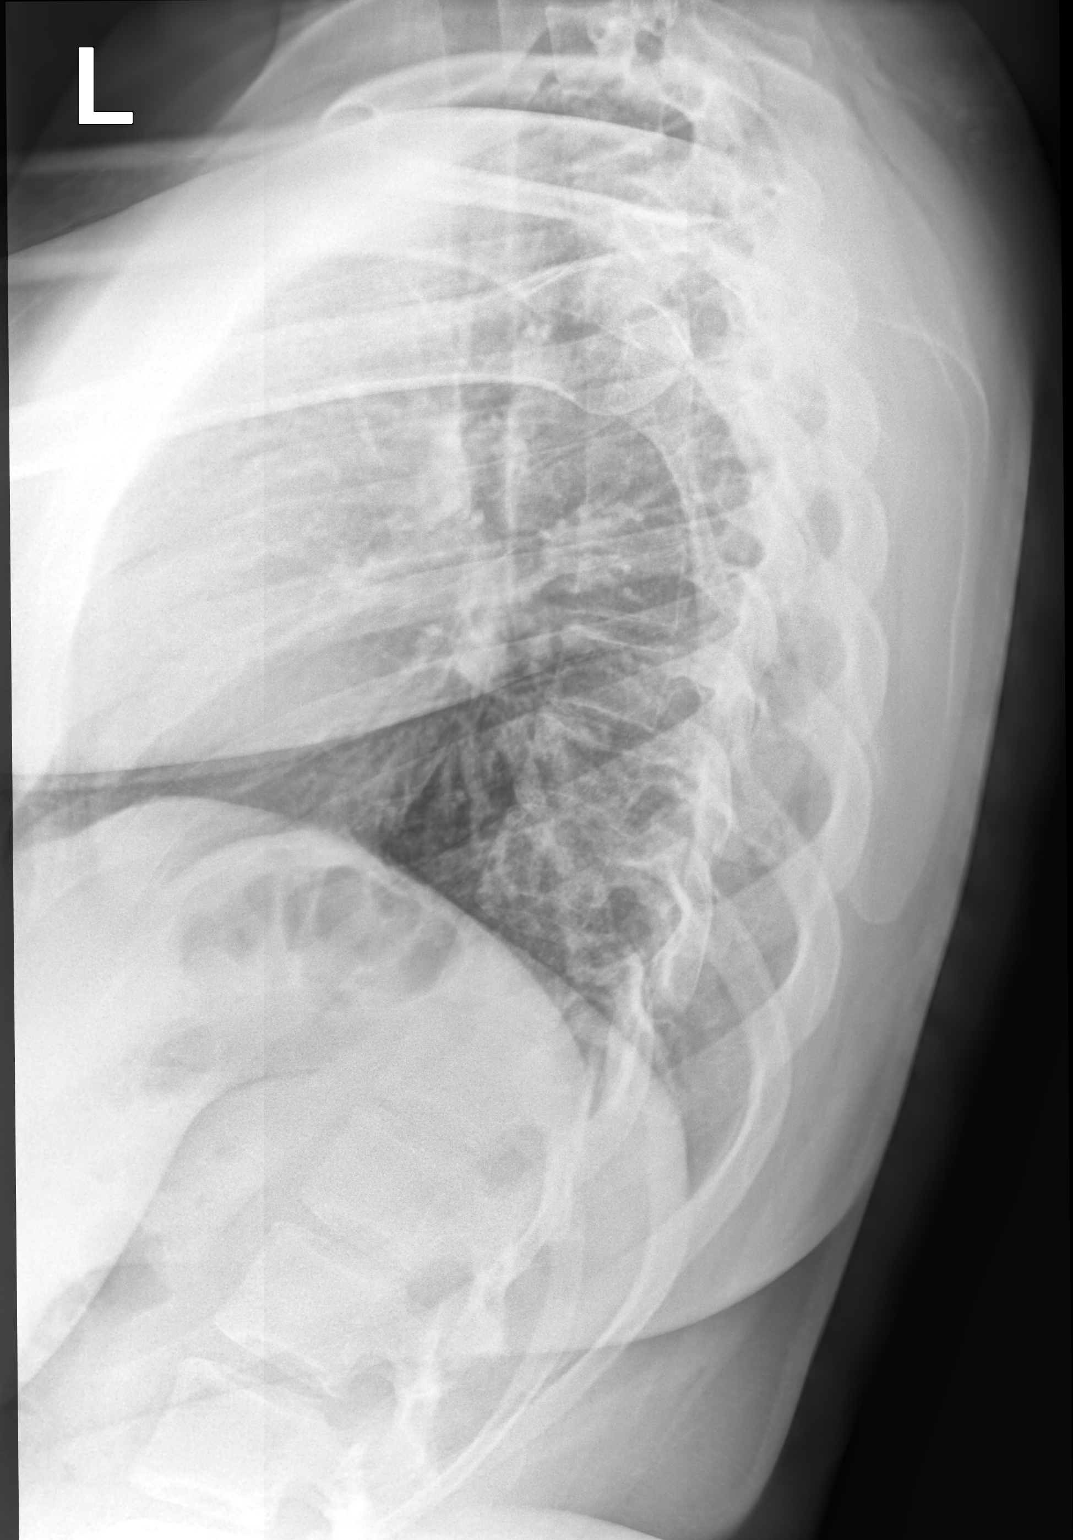

[thoracic spine lat (2 of 2)]
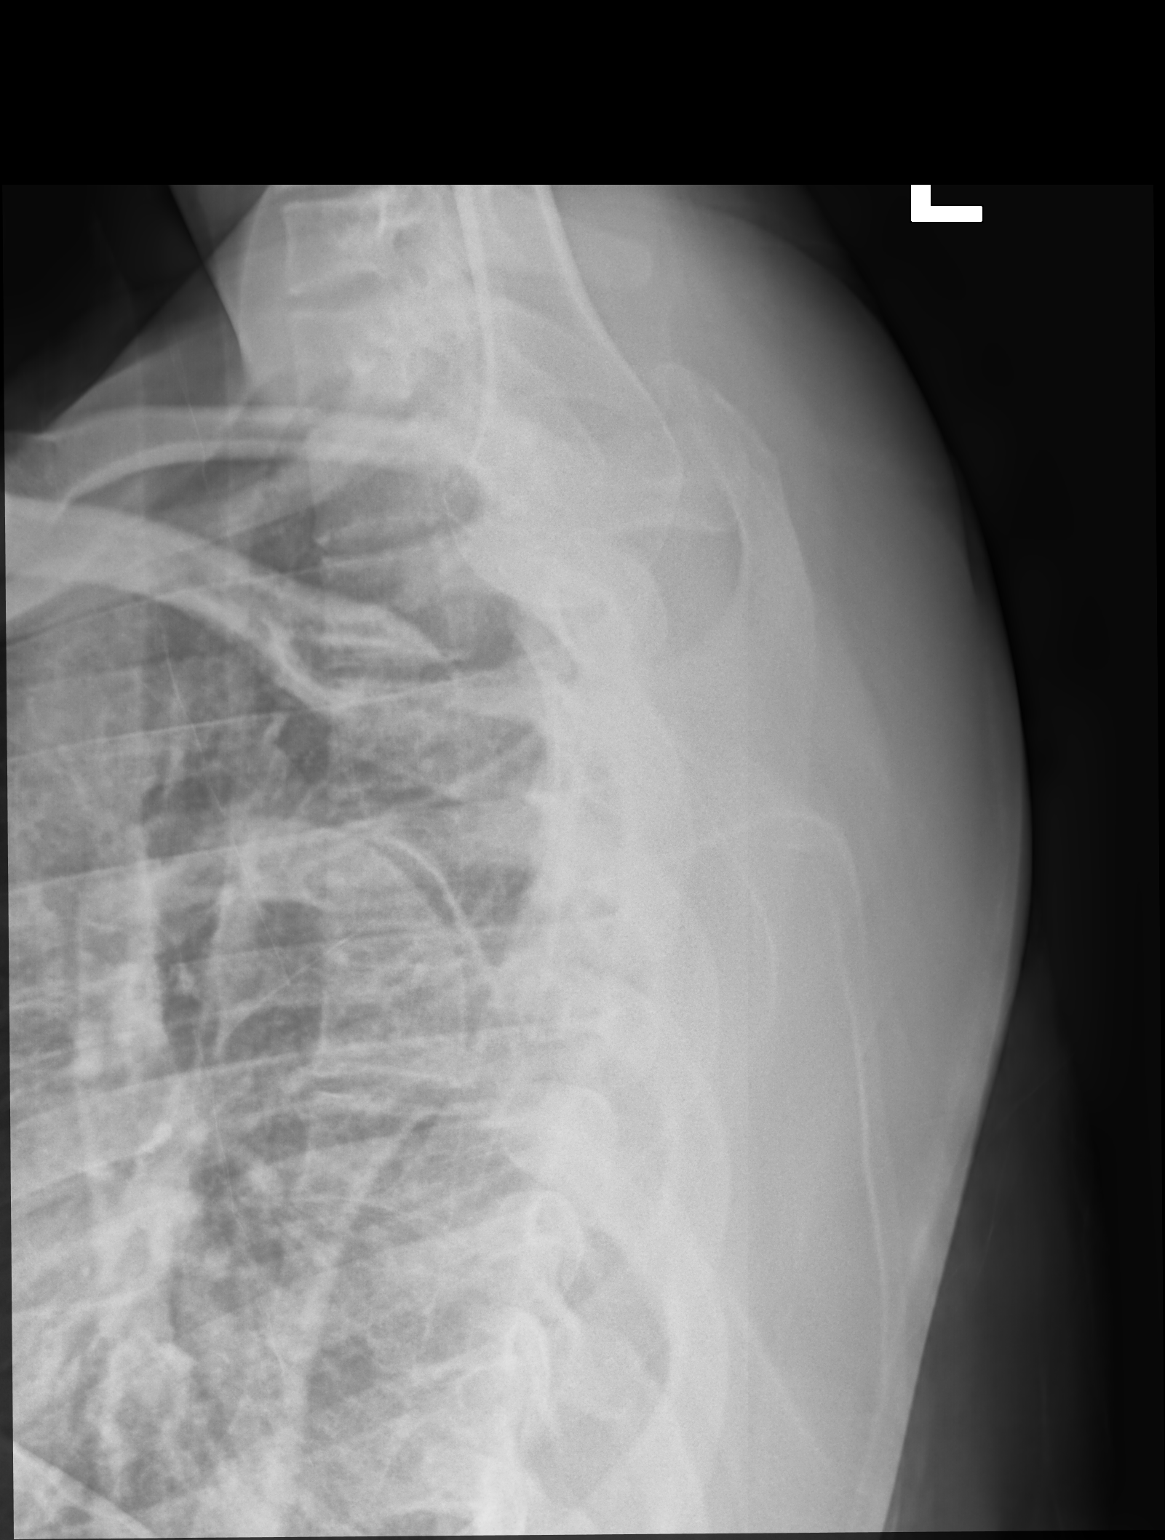

[3 of 3 positions shown; findings below may reference images not displayed]

FINDINGS: No fracture or dislocation of the thoracic or lumbar spine. Disc
spaces and vertebral body heights are preserved. There is a
dextroscoliosis of the thoracic spine, apex T7, maximum Cobb angle
29 degrees. The partially imaged chest and abdomen are unremarkable.
IMPRESSION: 1. There is a dextroscoliosis of the thoracic spine, apex T7,
maximum Cobb angle 29 degrees.

2. No fracture or dislocation. Disc spaces and vertebral body
heights are preserved.

## 2020-12-30 IMAGING — DX DG LUMBAR SPINE 2-3V
2 series · 2 of 2 positions shown · non-contrast
Comparison: None.

CLINICAL DATA: Scoliosis evaluation

EXAM:
THORACIC SPINE 2 VIEWS; LUMBAR SPINE - 2-3 VIEW

[lumbar spine ap]
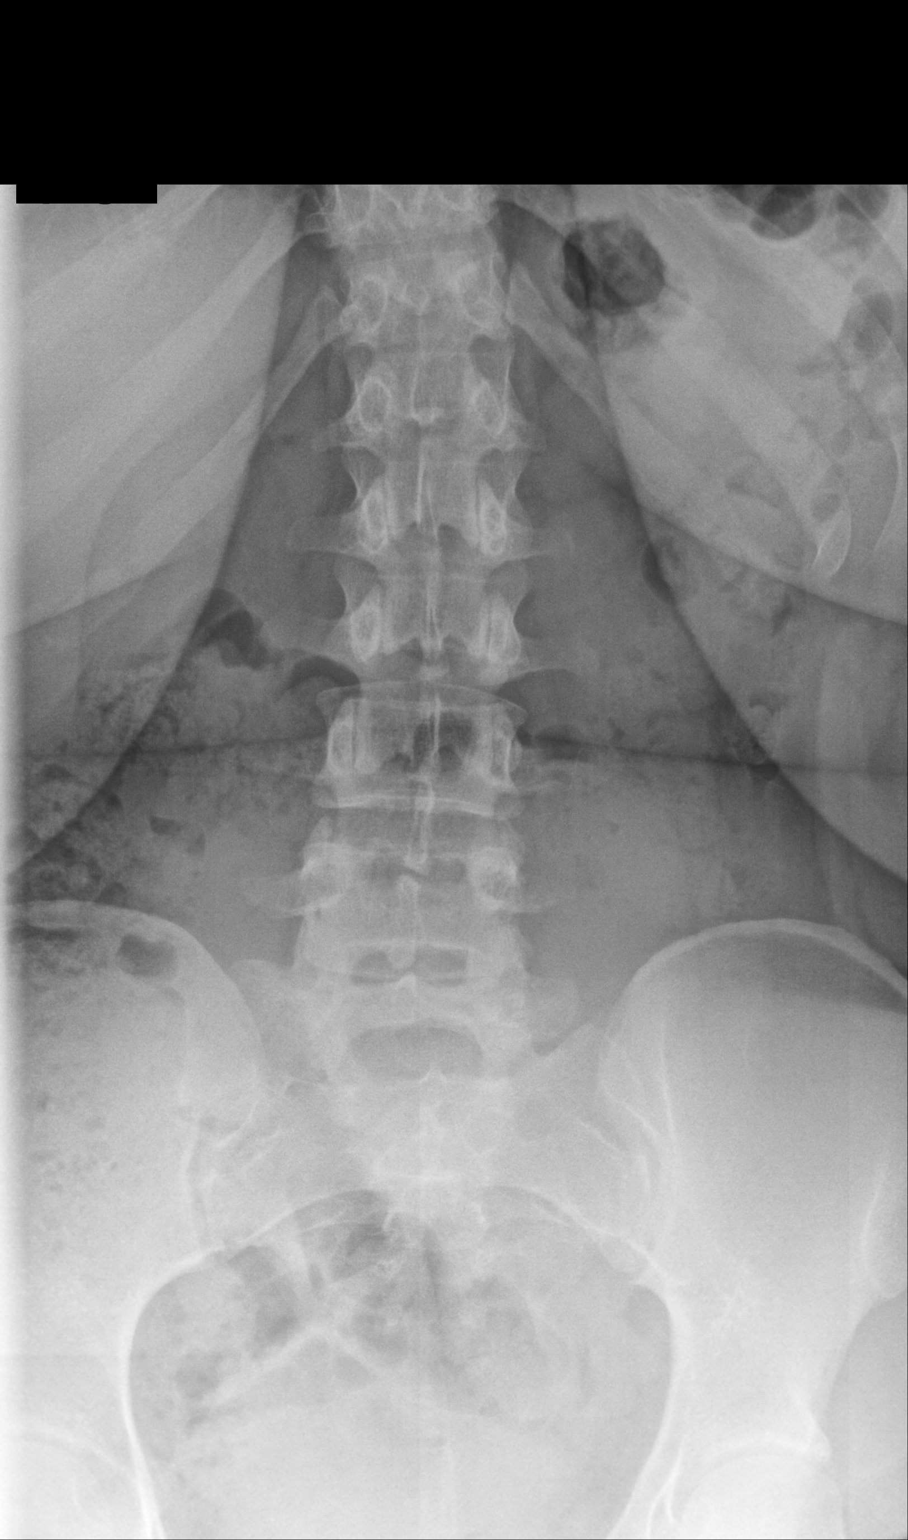

[lumbar spine lat]
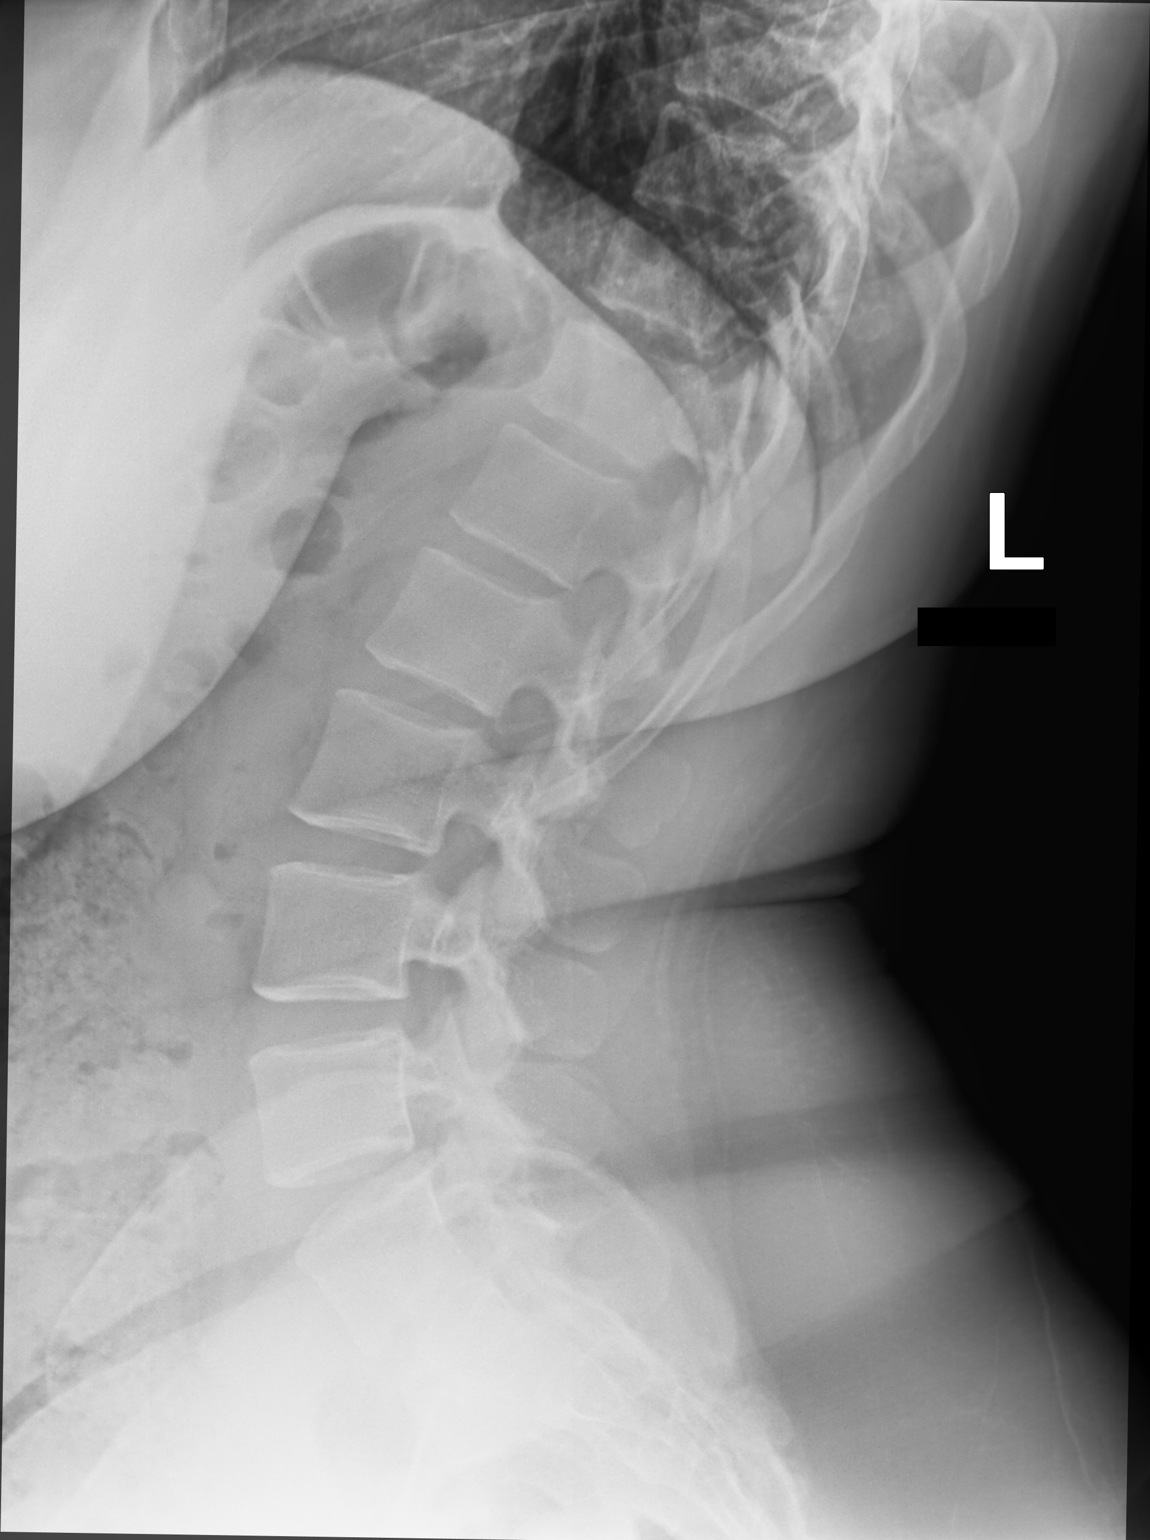

[2 of 2 positions shown; findings below may reference images not displayed]

FINDINGS: No fracture or dislocation of the thoracic or lumbar spine. Disc
spaces and vertebral body heights are preserved. There is a
dextroscoliosis of the thoracic spine, apex T7, maximum Cobb angle
29 degrees. The partially imaged chest and abdomen are unremarkable.
IMPRESSION: 1. There is a dextroscoliosis of the thoracic spine, apex T7,
maximum Cobb angle 29 degrees.

2. No fracture or dislocation. Disc spaces and vertebral body
heights are preserved.

## 2021-01-08 ENCOUNTER — Ambulatory Visit (INDEPENDENT_AMBULATORY_CARE_PROVIDER_SITE_OTHER): Payer: 59 | Admitting: Family Medicine

## 2021-01-08 ENCOUNTER — Other Ambulatory Visit: Payer: Self-pay

## 2021-01-08 ENCOUNTER — Encounter: Payer: Self-pay | Admitting: Family Medicine

## 2021-01-08 VITALS — BP 118/78 | HR 83 | Temp 98.1°F | Ht 64.0 in | Wt 234.0 lb

## 2021-01-08 DIAGNOSIS — Z0000 Encounter for general adult medical examination without abnormal findings: Secondary | ICD-10-CM

## 2021-01-08 DIAGNOSIS — F321 Major depressive disorder, single episode, moderate: Secondary | ICD-10-CM | POA: Diagnosis not present

## 2021-01-08 DIAGNOSIS — F840 Autistic disorder: Secondary | ICD-10-CM | POA: Diagnosis not present

## 2021-01-08 DIAGNOSIS — F419 Anxiety disorder, unspecified: Secondary | ICD-10-CM | POA: Diagnosis not present

## 2021-01-08 LAB — CBC WITH DIFFERENTIAL/PLATELET
Basophils Absolute: 0.1 10*3/uL (ref 0.0–0.1)
Basophils Relative: 0.9 % (ref 0.0–3.0)
Eosinophils Absolute: 0.2 10*3/uL (ref 0.0–0.7)
Eosinophils Relative: 2.1 % (ref 0.0–5.0)
HCT: 34.3 % — ABNORMAL LOW (ref 36.0–46.0)
Hemoglobin: 11.1 g/dL — ABNORMAL LOW (ref 12.0–15.0)
Lymphocytes Relative: 23.4 % (ref 12.0–46.0)
Lymphs Abs: 1.7 10*3/uL (ref 0.7–4.0)
MCHC: 32.4 g/dL (ref 30.0–36.0)
MCV: 83.9 fl (ref 78.0–100.0)
Monocytes Absolute: 0.3 10*3/uL (ref 0.1–1.0)
Monocytes Relative: 4.3 % (ref 3.0–12.0)
Neutro Abs: 5 10*3/uL (ref 1.4–7.7)
Neutrophils Relative %: 69.3 % (ref 43.0–77.0)
Platelets: 259 10*3/uL (ref 150.0–400.0)
RBC: 4.09 Mil/uL (ref 3.87–5.11)
RDW: 14.2 % (ref 11.5–15.5)
WBC: 7.2 10*3/uL (ref 4.0–10.5)

## 2021-01-08 LAB — COMPREHENSIVE METABOLIC PANEL
ALT: 10 U/L (ref 0–35)
AST: 17 U/L (ref 0–37)
Albumin: 3.6 g/dL (ref 3.5–5.2)
Alkaline Phosphatase: 59 U/L (ref 39–117)
BUN: 8 mg/dL (ref 6–23)
CO2: 25 mEq/L (ref 19–32)
Calcium: 8.9 mg/dL (ref 8.4–10.5)
Chloride: 104 mEq/L (ref 96–112)
Creatinine, Ser: 0.67 mg/dL (ref 0.40–1.20)
GFR: 124.32 mL/min (ref >60.00)
Glucose, Bld: 88 mg/dL (ref 70–99)
Potassium: 3.9 mEq/L (ref 3.5–5.1)
Sodium: 137 mEq/L (ref 135–145)
Total Bilirubin: 0.3 mg/dL (ref 0.2–1.2)
Total Protein: 7.2 g/dL (ref 6.0–8.3)

## 2021-01-08 LAB — LIPID PANEL
Cholesterol: 182 mg/dL (ref 0–200)
HDL: 67.3 mg/dL (ref >39.00)
LDL Cholesterol: 94 mg/dL (ref 0–99)
NonHDL: 114.28
Total CHOL/HDL Ratio: 3
Triglycerides: 103 mg/dL (ref 0.0–149.0)
VLDL: 20.6 mg/dL (ref 0.0–40.0)

## 2021-01-08 LAB — TSH: TSH: 2.17 u[IU]/mL (ref 0.35–5.50)

## 2021-01-08 LAB — HEMOGLOBIN A1C: Hgb A1c MFr Bld: 5.9 % (ref 4.6–6.5)

## 2021-01-08 MED ORDER — FLUTICASONE PROPIONATE 50 MCG/ACT NA SUSP: 1.0000 | Freq: Every day | NASAL | 6 refills | Status: DC

## 2021-01-08 MED ORDER — PAROXETINE HCL 10 MG PO TABS: ORAL_TABLET | ORAL | 2 refills | Status: DC

## 2021-01-08 NOTE — Progress Notes (Signed)
Subjective  Chief Complaint  Patient presents with   Annual Exam    HPI: Mandy Whitehead is a 20 y.o. female who presents to [facility] today for a Female Wellness Visit.  She also has the concerns and/or needs as listed above in the chief complaint. These will be addressed in addition to the Health Maintenance Visit.   Wellness Visit: annual visit with health maintenance review and exam without Pap  HM: 20 yo with autism spectrum disorder and not sexually active and vaccinated against HPV defers cervical cancer screen. Here with mom. Eligible for tdap and flu: pt declines today. Chronic disease management visit and/or acute problem visit: Sadness: family friend passed prematurely and pt is having a hard time with grief. She is exhibiting sxs of depression - see below. Wanting to keep to herself more. Low energy. She has been formally evaluated by autism center and given dx of several mood problems including anxiety, ocd and depression.  Depression screen [facility] 2/9 01/08/2021 12/18/2018  Decreased Interest 0 0  Down, Depressed, Hopeless 3 0  PHQ - 2 Score 3 0  Altered sleeping 3 -  Tired, decreased energy 3 -  Change in appetite 2 -  Feeling bad or failure about yourself  1 -  Trouble concentrating 2 -  Moving slowly or fidgety/restless 3 -  Suicidal thoughts 0 -  PHQ-9 Score 17 -  Difficult doing work/chores Somewhat difficult -    Assessment  1. Annual physical exam   2. Autism spectrum disorder   3. Anxiety   4. Morbid obesity ([place])   5. Current moderate episode of major depressive disorder without prior episode [facility])      Plan  Female Wellness Visit: Age appropriate Health Maintenance and Prevention measures were discussed with patient. Included topics are cancer screening recommendations, ways to keep healthy (see AVS) including dietary and exercise recommendations, regular eye and dental care, use of seat belts, and avoidance of moderate alcohol  use and tobacco use.  BMI: discussed patient's BMI and encouraged positive lifestyle modifications to help get to or maintain a target BMI. HM needs and immunizations were addressed and ordered. See below for orders. See HM and immunization section for updates.declines Routine labs and screening tests ordered including cmp, cbc and lipids where appropriate. Discussed recommendations regarding Vit D and calcium supplementation (see AVS)  Chronic disease f/u and/or acute problem visit: (deemed necessary to be done in addition to the wellness visit): Depression, active:  counseling done. Start paxil to help with sleep, anxiety and low mood. Education given. Mom to monitor. Close f/u. Continue counseling with autism center.    Follow up: 6 weeks to recheck mood   Orders Placed This Encounter  Procedures   CBC with Differential/Platelet   Comprehensive metabolic panel   Hepatitis C antibody   Hemoglobin A1c   Lipid panel   TSH   Meds ordered this encounter  Medications   fluticasone (FLONASE) 50 MCG/ACT nasal spray    Sig: Place 1 spray into both nostrils daily.    Dispense:  16 g    Refill:  6   PARoxetine (PAXIL) 10 MG tablet    Sig: Take 1 tablet (10 mg total) by mouth daily for 14 days, THEN 2 tablets (20 mg total) daily for 14 days.    Dispense:  60 tablet    Refill:  2       Body mass index is 40.17 kg/m. Wt Readings from Last 3  Encounters:  01/08/21 234 lb (106.1 kg)  07/10/19 203 lb 12.8 oz (92.4 kg)  12/18/18 201 lb (91.2 kg)   Need for contraception: No, abstinence  Patient Active Problem List   Diagnosis Date Noted   Current moderate episode of major depressive disorder without prior episode ([place]) 01/08/2021   Morbid obesity ([place]) 01/08/2021   Anxiety 01/08/2021   Iron deficiency anemia due to chronic blood loss 07/15/2019    menstrual    Scoliosis of thoracic spine 07/11/2019    dextroscoliosis confirmed by xra 06/2019    Hirsutism 07/10/2019   Seasonal  allergic rhinitis due to pollen 07/10/2019   Autism spectrum disorder 12/18/2018   Obsessive-compulsive disorder 12/18/2018   Acne vulgaris 10/03/2016   BMI (body mass index), pediatric, 95-99% for age 93/11/2016   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   Health Maintenance  Topic Date Due   COVID-19 Vaccine (4 - Booster for [place] series) 05/09/2020   INFLUENZA VACCINE  06/25/2021 (Originally 10/26/2020)   PAP-Cervical Cytology Screening  01/08/2022 (Originally 11/21/2019)   PAP SMEAR-Modifier  01/08/2022 (Originally 11/21/2019)   HIV Screening  01/08/2022 (Originally 11/20/2013)   TETANUS/TDAP  01/15/2022 (Originally 12/29/2020)   Pneumococcal Vaccine 7-39 Years old  Completed   HPV VACCINES  Completed   Hepatitis C Screening  Completed   Immunization History  Administered Date(s) Administered   DTaP 01/18/1999, 04/07/1999, 06/04/1999, 08/04/2000, 06/11/2003   HPV 9-valent 10/03/2016, 12/07/2016, 04/25/2017   Hepatitis A 07/18/2006, 01/10/2007, 10/03/2016   Hepatitis B 12/11/1998, 02/16/1999, 09/06/1999   HiB (PRP-T) 01/18/1999, 04/07/1999, 06/04/1999, 04/12/2000   IPV 01/18/1999, 04/07/1999, 08/04/2000, 06/11/2003   MMR 04/12/2000, 06/11/2003   Meningococcal Conjugate 12/30/2010, 07/17/2015   PFIZER(Purple Top)SARS-COV-2 Vaccination 07/05/2019, 07/31/2019, 03/14/2020   Pneumococcal Conjugate-13 02/16/1999, 05/08/1999, 01/10/2000   Tdap 12/30/2010   Varicella 12/31/2002, 07/18/2006   We updated and reviewed the patient's past history in detail and it is documented below. Allergies: Patient  reports no history of alcohol use. Past Medical History Patient  has no past medical history on file. Past Surgical History Patient  has no past surgical history on file. Social History   Socioeconomic History   Marital status: Single    Spouse name: Not on file   Number of children: Not on file   Years of education: Not on file   Highest education level: Not on file   Occupational History   Not on file  Tobacco Use   Smoking status: Never   Smokeless tobacco: Never  Vaping Use   Vaping Use: Never used  Substance and Sexual Activity   Alcohol use: Never   Drug use: Never   Sexual activity: Never  Other Topics Concern   Not on file  Social History Narrative   Not on file   Social Determinants of Health   Financial Resource Strain: Not on file  Food Insecurity: Not on file  Transportation Needs: Not on file  Physical Activity: Not on file  Stress: Not on file  Social Connections: Not on file   Family History  Problem Relation Age of Onset   Miscarriages / [place] Mother    High blood pressure Father    Arthritis Maternal Grandmother    Heart disease Maternal Grandmother    Arthritis Maternal Grandfather    High blood pressure Maternal Grandfather    Asthma Paternal Grandmother    Diabetes Paternal Grandmother    Heart disease Paternal Grandmother    High blood pressure Paternal Grandmother    High Cholesterol Paternal Grandmother  Kidney disease Paternal Grandmother    Cancer Paternal Grandfather     Review of Systems: Constitutional: negative for fever or malaise Ophthalmic: negative for photophobia, double vision or loss of vision Cardiovascular: negative for chest pain, dyspnea on exertion, or new LE swelling Respiratory: negative for SOB or persistent cough Gastrointestinal: negative for abdominal pain, change in bowel habits or melena Genitourinary: negative for dysuria or gross hematuria, no abnormal uterine bleeding or disharge Musculoskeletal: negative for new gait disturbance or muscular weakness Integumentary: negative for new or persistent rashes, no breast lumps Neurological: negative for TIA or stroke symptoms Psychiatric: negative for SI or delusions Allergic/Immunologic: negative for hives  Patient Care Team    Relationship Specialty Notifications Start End  Leamon Arnt, MD PCP - General Family  Medicine  12/18/18     Objective  Vitals: BP 118/78   Pulse 83   Temp 98.1 F (36.7 C) (Temporal)   Ht _0  (1.626 m)   Wt 234 lb (106.1 kg)   LMP 12/15/2020 (Exact Date)   SpO2 100%   BMI 40.17 kg/m  General:  Well developed, well nourished, no acute distress  Psych:  Alert and orientedx3,flat mood and affect HEENT:  Normocephalic, atraumatic, non-icteric sclera, PERRL, supple neck without adenopathy, mass or thyromegaly Cardiovascular:  Normal S1, S2, RRR without gallop, rub or murmur Respiratory:  Good breath sounds bilaterally, CTAB with normal respiratory effort Gastrointestinal: normal bowel sounds, soft, non-tender, no noted masses. No HSM MSK: no deformities, contusions. Joints are without erythema or swelling.  Skin:  Warm, no rashes or suspicious lesions noted Neurologic:    Mental status is normal. Gross motor and sensory exams are normal. Normal gait. No tremor    Commons side effects, risks, benefits, and alternatives for medications and treatment plan prescribed today were discussed, and the patient expressed understanding of the given instructions. Patient is instructed to call or message via MyChart if he/she has any questions or concerns regarding our treatment plan. No barriers to understanding were identified. We discussed Red Flag symptoms and signs in detail. Patient expressed understanding regarding what to do in case of urgent or emergency type symptoms.  Medication list was reconciled, printed and provided to the patient in AVS. Patient instructions and summary information was reviewed with the patient as documented in the AVS. This note was prepared with assistance of Dragon voice recognition software. Occasional wrong-word or sound-a-like substitutions may have occurred due to the inherent limitations of voice recognition software  This visit occurred during the SARS-CoV-2 public health emergency.  Safety protocols were in place, including screening questions  prior to the visit, additional usage of staff PPE, and extensive cleaning of exam room while observing appropriate contact time as indicated for disinfecting solutions.

## 2021-01-08 NOTE — Patient Instructions (Signed)
Please return in 6 weeks to recheck mood.   I will release your lab results to you on your MyChart account with further instructions. Please reply with any questions.    If you have any questions or concerns, please don't hesitate to send me a message via MyChart or call the office at [phone]. Thank you for visiting with [place] today! It's our pleasure caring for you.   Depression Medications:  Taking the medicine as directed and not missing any doses is one of the best things you can do to treat your depression.  Here are some things to keep in mind:  Side effects (stomach upset, some increased anxiety) may happen before you notice a benefit.  These side effects typically go away over time. Changes to your dose of medicine or a change in medication all together is sometimes necessary Most people need to be on medication at least 6-12 months Many people will notice an improvement within two weeks but the full effect of the medication can take up to 4-6 weeks Stopping the medication when you start feeling better often results in a return of symptoms If you start having thoughts of hurting yourself or others after starting this medicine, please call the office immediately at [phone].

## 2021-01-11 LAB — HEPATITIS C ANTIBODY
Hepatitis C Ab: NONREACTIVE
SIGNAL TO CUT-OFF: <0.02 (ref <1.00)

## 2021-01-12 ENCOUNTER — Ambulatory Visit (INDEPENDENT_AMBULATORY_CARE_PROVIDER_SITE_OTHER): Payer: 59 | Admitting: Clinical

## 2021-01-12 DIAGNOSIS — F840 Autistic disorder: Secondary | ICD-10-CM | POA: Diagnosis not present

## 2021-02-05 ENCOUNTER — Ambulatory Visit (INDEPENDENT_AMBULATORY_CARE_PROVIDER_SITE_OTHER): Payer: 59 | Admitting: Clinical

## 2021-02-05 DIAGNOSIS — F840 Autistic disorder: Secondary | ICD-10-CM

## 2021-02-16 ENCOUNTER — Encounter: Payer: Self-pay | Admitting: Family Medicine

## 2021-02-16 ENCOUNTER — Other Ambulatory Visit: Payer: Self-pay

## 2021-02-16 ENCOUNTER — Ambulatory Visit: Payer: 59 | Admitting: Family Medicine

## 2021-02-16 VITALS — BP 116/78 | HR 94 | Temp 98.0°F | Ht 64.0 in | Wt 235.0 lb

## 2021-02-16 DIAGNOSIS — F321 Major depressive disorder, single episode, moderate: Secondary | ICD-10-CM

## 2021-02-16 DIAGNOSIS — F419 Anxiety disorder, unspecified: Secondary | ICD-10-CM | POA: Diagnosis not present

## 2021-02-16 MED ORDER — PAROXETINE HCL 10 MG PO TABS: 10.0000 mg | ORAL_TABLET | Freq: Every day | ORAL | 3 refills | Status: DC

## 2021-02-16 NOTE — Patient Instructions (Signed)
Please return in 6 months to recheck mood. Sooner if you are having problems.   Glad you are feeling better.  Keep taking the paxil 10mg  daily.   If you have any questions or concerns, please don't hesitate to send me a message via MyChart or call the office at [phone]. Thank you for visiting with [place] today! It's our pleasure caring for you.

## 2021-02-16 NOTE — Progress Notes (Signed)
Subjective  CC:  Chief Complaint  Patient presents with   Anxiety   Depression    States medication is working okay    HPI: Mandy Whitehead is a 20 y.o. female who presents to the office today to address the problems listed above in the chief complaint, mood problems. On paxil 10 now x 6 weeks; see last note. Doing much better overall. Mood is brighter, more motivated. Less anxiety. She feels more like herself; is more focused and able to do more. Friends and family members notice the improvement as well. No Aes on meds but when she tried to increase to 20mg  (once), she felt "overwhelmed" so she has kept to the 10 daily.  She denies current suicidal or homicidal plan or intent.  Depression screen [facility] 2/9 02/16/2021 01/08/2021 12/18/2018  Decreased Interest 0 0 0  Down, Depressed, Hopeless 1 3 0  PHQ - 2 Score 1 3 0  Altered sleeping 2 3 -  Tired, decreased energy 2 3 -  Change in appetite 0 2 -  Feeling bad or failure about yourself  1 1 -  Trouble concentrating 0 2 -  Moving slowly or fidgety/restless 0 3 -  Suicidal thoughts 0 0 -  PHQ-9 Score 6 17 -  Difficult doing work/chores Somewhat difficult Somewhat difficult -   GAD 7 : Generalized Anxiety Score 02/16/2021 01/08/2021  Nervous, Anxious, on Edge 1 2  Control/stop worrying 2 2  Worry too much - different things 2 3  Trouble relaxing 0 0  Restless 3 3  Easily annoyed or irritable 0 0  Afraid - awful might happen 0 1  Total GAD 7 Score 8 11  Anxiety Difficulty Somewhat difficult Somewhat difficult     Assessment  1. Current moderate episode of major depressive disorder without prior episode ([place])   2. Anxiety      Plan  Depression/anxiety:  much better on medication. Will continue paxil 10mg  daily. Recheck 6 months.  Reviewed concept of mood problems caused by biochemical imbalance of neurotransmitters and rationale for treatment with medications and therapy.  Counseling given: pt was instructed to contact office,  on-call physician or crisis Hotline if symptoms worsen significantly. If patient develops any suicidal or homicidal thoughts, she is directed to the ER immediately.   Follow up: No follow-ups on file.  No orders of the defined types were placed in this encounter.  Meds ordered this encounter  Medications   PARoxetine (PAXIL) 10 MG tablet    Sig: Take 1 tablet (10 mg total) by mouth daily.    Dispense:  90 tablet    Refill:  3       I reviewed the patients updated PMH, FH, and SocHx.    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   Evening Primrose Oil 1000 MG CAPS Take 1 capsule  by mouth daily.   Ferrous Sulfate (IRON) 325 (65 Fe) MG TABS Take by mouth.   fluticasone (FLONASE) 50 MCG/ACT nasal spray Place 1 spray into both nostrils daily.   Magnesium 100 MG CAPS Take by mouth.   Multiple Vitamin (MULTIVITAMIN) capsule Take 1 capsule by mouth daily.   Norgestimate-Ethinyl Estradiol Triphasic 0.18/0.215/0.25 MG-35 MCG tablet Take 1 tablet by mouth daily. From dermatology for Acne   UNABLE TO FIND daily. Med Name: [place] Support   vitamin C (ASCORBIC ACID) 500 MG tablet Take 500 mg by mouth daily.    Allergies: Patient is allergic to penicillin g. Family history:  Patient family history includes Arthritis  in her maternal grandfather and maternal grandmother; Asthma in her paternal grandmother; Cancer in her paternal grandfather; Diabetes in her paternal grandmother; Heart disease in her maternal grandmother and paternal grandmother; High Cholesterol in her paternal grandmother; High blood pressure in her father, maternal grandfather, and paternal grandmother; Kidney disease in her paternal grandmother; 70 / [place] in her mother. Social History   Socioeconomic History   Marital status: Single    Spouse name: Not on file   Number of children: Not on file   Years of education: Not on file   Highest education level: Not on file  Occupational History   Not on file  Tobacco Use   Smoking status: Never   Smokeless tobacco: Never  Vaping Use   Vaping Use: Never used  Substance and Sexual Activity   Alcohol use: Never   Drug use: Never   Sexual activity: Never  Other Topics Concern   Not on file  Social History Narrative   Not on file   Social Determinants of Health   Financial Resource Strain: Not on file  Food Insecurity: Not on file  Transportation Needs: Not on file  Physical Activity: Not on file  Stress: Not on file  Social Connections: Not on file     Review of Systems: Constitutional: Negative for fever malaise or anorexia Cardiovascular: negative for chest pain Respiratory: negative for SOB or persistent cough Gastrointestinal: negative for abdominal pain  Objective  Vitals: BP 116/78   Pulse 94   Temp 98 F (36.7 C) (Temporal)   Ht 5\' 4"  (1.626 m)   Wt 235 lb (106.6 kg)   LMP 02/10/2021   SpO2 97%   BMI 40.34 kg/m  General: no acute distress, well appearing, no apparent distress, well groomed Psych: more engaged today.   Commons side effects, risks, benefits, and alternatives for medications and treatment plan prescribed today were discussed, and the patient expressed understanding of the given instructions. Patient is instructed to call or  message via MyChart if he/she has any questions or concerns regarding our treatment plan. No barriers to understanding were identified. We discussed Red Flag symptoms and signs in detail. Patient expressed understanding regarding what to do in case of urgent or emergency type symptoms.  Medication list was reconciled, printed and provided to the patient in AVS. Patient instructions and summary information was reviewed with the patient as documented in the AVS. This note was prepared with assistance of Dragon voice recognition software. Occasional wrong-word or sound-a-like substitutions may have occurred due to the inherent limitations of voice recognition software

## 2021-03-04 ENCOUNTER — Encounter: Payer: Self-pay | Admitting: Family Medicine

## 2021-03-04 ENCOUNTER — Ambulatory Visit (INDEPENDENT_AMBULATORY_CARE_PROVIDER_SITE_OTHER): Payer: 59 | Admitting: Clinical

## 2021-03-04 DIAGNOSIS — F840 Autistic disorder: Secondary | ICD-10-CM

## 2021-03-04 DIAGNOSIS — F419 Anxiety disorder, unspecified: Secondary | ICD-10-CM

## 2021-03-04 NOTE — Progress Notes (Signed)
Diagnosis: F84.0, F41.9 Time of Session: 1:00PM-1:40pm CPT code: 24580D-[id number]  Mandy Whitehead was seen remotely using secure video conferencing. She was in her home and the therapist was in her office at the time of the appointment. She shared that she has especially been struggling with light sensitivity, and upon further discussion it appears this is most apparent during periods of transition from bright to dark and vice versa. Therapist again strongly encouraged occupational therapy. Therapist also suggested looking into transitional glasses she can wear to help make the transition from bright to dark more gradually, as well as doing so manually at home by gradually turning on more lights before leaving to go somewhere. She is scheduled to be seen again in February.  Objectives Related Problem: Mandy Whitehead frequently overthinks situations and experiences frequent excessive worry Description: Mandy Whitehead would like to increase her ability to remain positive in stress inducing situations Target Date: 2021-12-02 Frequency: Biweekly Modality: individual Progress: 0% Planned Intervention: Mandy Whitehead will have opportunities to process experiences in session  Planned Intervention: Therapist will provide referrals and recommendations for additional resources as appropriate  Planned Intervention: Therapist will help Mandy Whitehead to identify and disengage from maladaptive thoughts and behaviors using CBT based strategies  Related Problem: Mandy Whitehead's primary coping strategy has been repetitive pacing, which has been hurting her feet Description: Mandy Whitehead would like to decrease the frequency and duration of repetitive pacing Target Date: 2021-12-02 Frequency: Biweekly Modality: individual Progress: 0% Planned Intervention: Therapist will help Mandy Whitehead consider strategies to manage overwhelming sensory experiences  Treatment Plan Client Abilities/Strengths  Mandy Whitehead was able to describe the challenges bringing her to therapy and appeared motivated to work  on them. She described herself as punctual and supportive of others. She shared that she is learning to be more talkative.  Client Treatment Preferences  Mandy Whitehead prefers virtual therapy and is also open to in-person therapy.  Client Statement of Needs  Mandy Whitehead is seeking cognitive and behavioral therapy to address symptoms of anxiety and depression. She would like to develop additional coping strategies to repetitive pacing, which she does to a degree that has been causing foot pain.  Treatment Level  Biweekly/Three weeks  Symptoms  Anxiety : excessive worry, avoidance of situations that provoke anxiety, difficulty with changes in plan and routine, perceived difficulty breathing, possible panic attacks (Status: maintained). Depression: lack of motivation, loss of pleasure in previously enjoyed activities (Status: maintained).  Problems Addressed  New Description, New Description  Goals 1. Mandy Whitehead frequently overthinks situations and experiences frequent excessive worry Objective Mandy Whitehead would like to increase her ability to remain positive in stress inducing situations  Target Date: 2021-12-02 Frequency: Biweekly  Progress: 0 Modality: individual  Related Interventions Therapist will provide referrals and recommendations for additional resources as appropriate  Therapist will engage Christon in gradual exposure therapy as appropriate  Therapist will help Ladean to identify and disengage from maladaptive thoughts and behaviors using CBT based strategies Mandy Whitehead will have opportunities to process experiences in session 2. 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

## 2021-03-05 ENCOUNTER — Other Ambulatory Visit: Payer: Self-pay

## 2021-03-05 DIAGNOSIS — F88 Other disorders of psychological development: Secondary | ICD-10-CM

## 2021-03-05 DIAGNOSIS — F840 Autistic disorder: Secondary | ICD-10-CM

## 2021-03-18 ENCOUNTER — Other Ambulatory Visit: Payer: Self-pay

## 2021-03-18 ENCOUNTER — Encounter: Payer: Self-pay | Admitting: Occupational Therapy

## 2021-03-18 ENCOUNTER — Ambulatory Visit: Payer: 59 | Attending: Family Medicine | Admitting: Occupational Therapy

## 2021-03-18 DIAGNOSIS — F840 Autistic disorder: Secondary | ICD-10-CM | POA: Insufficient documentation

## 2021-03-18 DIAGNOSIS — R278 Other lack of coordination: Secondary | ICD-10-CM | POA: Insufficient documentation

## 2021-03-18 DIAGNOSIS — R41840 Attention and concentration deficit: Secondary | ICD-10-CM | POA: Diagnosis present

## 2021-03-18 DIAGNOSIS — R41844 Frontal lobe and executive function deficit: Secondary | ICD-10-CM | POA: Insufficient documentation

## 2021-03-19 NOTE — Therapy (Signed)
[facility]. [place], [place], [address] Phone: [phone]   Fax:  [phone]  Occupational Therapy Evaluation  Patient Details  Name: Mandy Whitehead MRN: [id number] Date of Birth: 11-18-2000 Referring Provider (OT): Uvaldo Bristle, MD (PCP)   Encounter Date: 03/18/2021   OT End of Session - 03/18/21 1327     Visit Number 1    Date for OT Re-Evaluation 06/16/21    Authorization Type [employer]    Authorization - Visit Number 1    Authorization - Number of Visits 30   per calendar year   OT Start Time 2536    OT Stop Time 1404    OT Time Calculation (min) 49 min    Activity Tolerance Patient tolerated treatment well    Behavior During Therapy [facility] for tasks assessed/performed            History reviewed. No pertinent past medical history.  History reviewed. No pertinent surgical history.  There were no vitals filed for this visit.   Subjective Assessment - 03/18/21 1321     Subjective  Pt arrives to session w/ primary concerns related to difficulty sleeping and increased sensitivity to auditory input. Pt states she has a physchologist who is the one who recommended her to OP OT. To manage her responsiveness to sensory input, pt's mother reports they have tried many different types of noise cancelling headphones, sunglasses, various fidgets, and weighted blankets. Additionally of note, pt states she cannot tolerate the texture of the carpet, but walks a lot, so she has to wear shoes in the house, which have been working for her so far.   Patient is accompanied by: Family member   Mother Mandy Whitehead)   Pertinent History Autism spectrum disorder and delayed social and emotional development; PMH includes major depressive disorder, anxiety, scoliosis of thoracic spine, obesity, hirsutism, and OCD   Patient Stated Goals Sensitivity to light; getting to sleep    Currently in Pain? No/denies             [facility] OT Assessment -  03/18/21 1327       Assessment   Medical Diagnosis Autism spectrum disorder; delayed social and emotional development   Referring Provider (OT) Uvaldo Bristle, MD    PCP   Hand Dominance Right   Prior Therapy No   Prior Function    Level of Independence Other (comment)   Modified independent   ADL    Upper Body Bathing Modified independent   Pt reports aversion to both showering and bathing; unclear as to why. Per mother's report, she needs to be reminded to shower, otherwise she would just avoid it   Upper Body Dressing Other (comment)   Reports needing to cut all tags out of her clothing and will only wear soft textures, preferrably loose fitting; per mother's report, pt also has difficulty wearing new clothing, even if it is the exact same article of clothing but is new   ADL comments Reports significant difficulty w/ sleep and often wakes up at night; typically awake when others are asleep due to the house being quiet   Sleeps w/ a noise maker   Written Expression    Written Experience Not tested   Reports difficulty; to be further assessed     Cognition   Overall Cognitive Status Cognition to be further assessed in functional context PRN   Area of Impairment Memory;Problem solving      Sensation   Additional Comments Pt reports  aversion to auditory input, light, tactile input/rough textures      Coordination   9 Hole Peg Test Right;Left    Right 9 Hole Peg Test 25 sec    Left 9 Hole Peg Test 28 sec      Hand Function   Right Hand Grip (lbs) 41    Left Hand Grip (lbs) 29             OT Education - 03/18/21 1327     Education Details Education provided on role and purpose of OT    Person(s) Educated Patient;Parent(s)    Methods Explanation    Comprehension Verbalized understanding             OT Short Term Goals - 03/18/21 1827       OT SHORT TERM GOAL #1   Title Pt will be able to identify at least 3 new activities to participate in to decrease impact of  sensory input    Baseline Pt to complete Sensory Diet Exploration: Activity Checklist    Time 4    Period Weeks    Status New    Target Date 04/15/21             OT Long Term Goals - 03/18/21 1827       OT LONG TERM GOAL #1   Title Handwriting goal -- TBD    Baseline Reports difficulty w/ handwriting; to be further assessed    Time 8    Period Weeks    Status New    Target Date 05/13/21      OT LONG TERM GOAL #2   Title Pt will experience improved participation in sleep as evidenced by decreasing score on PROMIS - Sleep Disturbance short form by d/c    Baseline TBA    Time 8    Period Weeks    Status New    Target Date 05/13/21      OT LONG TERM GOAL #3   Title Pt will improve success w/ small manipulatives (e.g., clothing fasteners) as evidenced by decreasing 9-Hole Peg Test time by at least 3 sec w/ each hand    Baseline 25 sec w/ R hand; 28 sec w/ L hand    Time 8    Period Weeks    Status New    Target Date 05/13/21      OT LONG TERM GOAL #4   Title Pt will report being able to select an outfit w/ Mod I, using compensatory strategies or AE prn, at least 50% of the time by d/c    Baseline Does not participate in outfit selection, per parent report    Time 8    Period Weeks    Status New    Target Date 05/13/21             Plan - 03/18/21 1823     Clinical Impression Statement Pt is a 20 y/o female who presents to OP OT due to sensory sensitivity affecting participation in ADLs. Pt currently lives with her parents in an apartment and reports most difficulty w/ sleep, tolerating auditory input, and light sensitivity. PMH includes includes major depressive disorder, anxiety, scoliosis of thoracic spine, obesity, hirsutism, and OCD. Pt will benefit from skilled occupational therapy services to address compensatory and adaptive strategies, including AE prn, to increase tolerance to sensory input, FMC/coordination, handwriting, and implementation of an HEP prn in  order to improve participation in ADLs.    OT Occupational Profile and History  Problem Focused Assessment - Including review of records relating to presenting problem    Occupational performance deficits (Please refer to evaluation for details): ADL's;IADL's;Rest and Sleep;Leisure;Social Participation    Body Structure / Function / Physical Skills ADL;IADL;Coordination;FMC;Dexterity;Sensation    Cognitive Skills Attention;Problem Solve    Psychosocial Skills Coping Strategies;Interpersonal Interaction;Routines and Behaviors;Environmental  Adaptations;Habits    Rehab Potential Good    Clinical Decision Making Several treatment options, min-mod task modification necessary    Comorbidities Affecting Occupational Performance: Presence of comorbidities impacting occupational performance    Modification or Assistance to Complete Evaluation  No modification of tasks or assist necessary to complete eval    OT Frequency 1x / week    OT Duration 8 weeks    OT Treatment/Interventions Self-care/ADL training;Therapeutic exercise;Coping strategies training;Patient/family education;Psychosocial skills training;Cognitive remediation/compensation;DME and/or AE instruction;Energy conservation;Therapeutic activities    Plan Assess handwriting; discuss sensory diet checklist    Consulted and Agree with Plan of Care Patient;Family member/caregiver    Family Member Consulted Mother Mandy Whitehead)            Patient will benefit from skilled therapeutic intervention in order to improve the following deficits and impairments:   Body Structure / Function / Physical Skills: ADL, IADL, Coordination, FMC, Dexterity, Sensation Cognitive Skills: Attention, Problem Solve Psychosocial Skills: Coping Strategies, Interpersonal Interaction, Routines and Behaviors, Environmental  Adaptations, Habits   Visit Diagnosis: Autism spectrum disorder  Frontal lobe and executive function deficit  Attention and concentration  deficit  Other lack of coordination   Problem List Patient Active Problem List   Diagnosis Date Noted   Current moderate episode of major depressive disorder without prior episode ([place]) 01/08/2021   Morbid obesity ([place]) 01/08/2021   Anxiety 01/08/2021   Iron deficiency anemia due to chronic blood loss 07/15/2019   Scoliosis of thoracic spine 07/11/2019   Hirsutism 07/10/2019   Seasonal allergic rhinitis due to pollen 07/10/2019   Autism spectrum disorder 12/18/2018   Obsessive-compulsive disorder 12/18/2018   Acne vulgaris 10/03/2016   BMI (body mass index), pediatric, 95-99% for age 41/11/2016   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    Kathrine Cords, OTR/L, MSOT 03/18/2021, 6:27 PM  [facility]. [place], [place], [address] Phone: [phone]   Fax:  [phone]  Name: Mandy Whitehead MRN: [id number] Date of Birth: 09-05-2000

## 2021-03-25 ENCOUNTER — Encounter: Payer: Self-pay | Admitting: Occupational Therapy

## 2021-03-25 ENCOUNTER — Ambulatory Visit: Payer: 59 | Admitting: Occupational Therapy

## 2021-03-25 ENCOUNTER — Other Ambulatory Visit: Payer: Self-pay

## 2021-03-25 DIAGNOSIS — R41844 Frontal lobe and executive function deficit: Secondary | ICD-10-CM

## 2021-03-25 DIAGNOSIS — F840 Autistic disorder: Secondary | ICD-10-CM

## 2021-03-25 DIAGNOSIS — R41840 Attention and concentration deficit: Secondary | ICD-10-CM

## 2021-03-25 DIAGNOSIS — R278 Other lack of coordination: Secondary | ICD-10-CM

## 2021-03-26 NOTE — Therapy (Signed)
[facility]. [place], [place], [address] Phone: [phone]   Fax:  [phone]  Occupational Therapy Treatment  Patient Details  Name: Mandy Whitehead MRN: [id number] Date of Birth: 2000/11/25 Referring Provider (OT): Uvaldo Bristle, MD (PCP)   Encounter Date: 03/25/2021   OT End of Session - 03/25/21 1320     Visit Number 2    Date for OT Re-Evaluation 06/16/21    Authorization Type [employer]    Authorization - Visit Number 2    Authorization - Number of Visits 30   per calendar year   OT Start Time 1318    OT Stop Time 1400    OT Time Calculation (min) 42 min    Activity Tolerance Patient tolerated treatment well    Behavior During Therapy [facility] for tasks assessed/performed            History reviewed. No pertinent past medical history.  History reviewed. No pertinent surgical history.  There were no vitals filed for this visit.   Subjective Assessment - 03/25/21 1320     Subjective  Pt reported that using her rain sounds while she was in the bathroom helped!    Patient is accompanied by: Family member   Mother [name])   Pertinent History Autism spectrum disorder and delayed social and emotional development; PMH includes major depressive disorder, anxiety, scoliosis of thoracic spine, obesity, hirsutism, and OCD    Patient Stated Goals Sensitivity to light; getting to sleep    Currently in Pain? No/denies             OT Education - 03/25/21 1355     Compensatory Strategies OT discussed compensatory strategies to improve participation in ADLs, focusing on showering and sleep. Options discussed include: trying goggles in the shower, using soft washcloth, adjusting lighting while sleeping, adjusting sleepwear, and various options for listening to sleep sounds.    Handwriting Pt practiced handwriting on wide lined paper; legibility was good w/ most letters formed bottom to top and slightly increased spacing  between letters.            OT Education - 03/25/21 1355     Education Details Sensory diet checklist and related education regarding different types of sensory input and how to incorporate sensory activities to prevent overstimulation or for recovery    Person(s) Educated Patient;Parent(s)    Methods Explanation;Handout    Comprehension Verbalized understanding             OT Short Term Goals - 03/25/21 1321       OT SHORT TERM GOAL #1   Title Pt will be able to identify at least 3 new activities to participate in to decrease impact of sensory input    Baseline Pt to complete Sensory Diet Exploration: Activity Checklist    Time 4    Period Weeks    Status On-going    Target Date 04/15/21             OT Long Term Goals - 03/25/21 1325       OT LONG TERM GOAL #1   Title Handwriting goal -- TBD    Baseline Reports difficulty w/ handwriting; to be further assessed    Time 8    Period Weeks    Status On-going    Target Date 05/13/21      OT LONG TERM GOAL #2   Title Pt will experience improved participation in sleep as evidenced by decreasing score on PROMIS -  Sleep Disturbance short form by d/c    Baseline Raw sum: 34    Time 8    Period Weeks    Status On-going    Target Date 05/13/21      OT LONG TERM GOAL #3   Title Pt will improve success w/ small manipulatives (e.g., clothing fasteners) as evidenced by decreasing 9-Hole Peg Test time by at least 3 sec w/ each hand    Baseline 25 sec w/ R hand; 28 sec w/ L hand    Time 8    Period Weeks    Status On-going    Target Date 05/13/21      OT LONG TERM GOAL #4   Title Pt will report being able to select an outfit w/ Mod I, using compensatory strategies or AE prn, at least 50% of the time by d/c    Baseline Does not participate in outfit selection, per parent report    Time 8    Period Weeks    Status On-going    Target Date 05/13/21             Plan - 03/25/21 1326     Clinical Impression  Statement OT facilitated discussion regarding most impacted daily activities (e.g., sleep, showering), attempting to identify input that is most disruptive and problem-solving w/ pt to identify options for modifications and compensatory strategies. Light, auditory input, and tactile input appear to be the most affecting w/ OT tailoring strategies and recommendations respectively. OT additionally dicussed likely benefit of working to identify pt-specific sensory diet and attempting to incorporate helpful and preferred activities to prevent overstimulation as well as to regulate reponses when they do occur. Handwriting was also assessed this session w/ pt demonstrating bottom-to-top letter formation; this is likely impacting legibility and efficiency as well as spacing.   OT Occupational Profile and History Problem Focused Assessment - Including review of records relating to presenting problem    Occupational performance deficits (Please refer to evaluation for details): ADL's;IADL's;Rest and Sleep;Leisure;Social Participation    Body Structure / Function / Physical Skills ADL;IADL;Coordination;FMC;Dexterity;Sensation    Cognitive Skills Attention;Problem Solve    Psychosocial Skills Coping Strategies;Interpersonal Interaction;Routines and Behaviors;Environmental  Adaptations;Habits    Rehab Potential Good    Clinical Decision Making Several treatment options, min-mod task modification necessary    Comorbidities Affecting Occupational Performance: Presence of comorbidities impacting occupational performance    Modification or Assistance to Complete Evaluation  No modification of tasks or assist necessary to complete eval    OT Frequency 1x / week    OT Duration 8 weeks    OT Treatment/Interventions Self-care/ADL training;Therapeutic exercise;Coping strategies training;Patient/family education;Psychosocial skills training;Cognitive remediation/compensation;DME and/or AE instruction;Energy  conservation;Therapeutic activities    Plan Discuss importance of addressing handwriting (impact of bottom-to-top letters); review pros/cons of lighting adjustments trialed during sleep and discuss compensatory strategies for next sensory system   Consulted and Agree with Plan of Care Patient;Family member/caregiver    Family Member Consulted Mother [name])             Patient will benefit from skilled therapeutic intervention in order to improve the following deficits and impairments:   Body Structure / Function / Physical Skills: ADL, IADL, Coordination, FMC, Dexterity, Sensation Cognitive Skills: Attention, Problem Solve Psychosocial Skills: Coping Strategies, Interpersonal Interaction, Routines and Behaviors, Environmental  Adaptations, Habits   Visit Diagnosis: Autism spectrum disorder  Frontal lobe and executive function deficit  Attention and concentration deficit  Other lack of coordination    Problem  List Patient Active Problem List   Diagnosis Date Noted   Current moderate episode of major depressive disorder without prior episode ([place]) 01/08/2021   Morbid obesity ([place]) 01/08/2021   Anxiety 01/08/2021   Iron deficiency anemia due to chronic blood loss 07/15/2019   Scoliosis of thoracic spine 07/11/2019   Hirsutism 07/10/2019   Seasonal allergic rhinitis due to pollen 07/10/2019   Autism spectrum disorder 12/18/2018   Obsessive-compulsive disorder 12/18/2018   Acne vulgaris 10/03/2016   BMI (body mass index), pediatric, 95-99% for age 59/11/2016   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    Kathrine Cords, OT 03/25/2021, 5:56 PM  [facility]. [place], [place], [address] Phone: [phone]   Fax:  [phone]  Name: Mandy Whitehead MRN: [id number] Date of Birth: 25-Oct-2000

## 2021-04-01 ENCOUNTER — Other Ambulatory Visit: Payer: Self-pay

## 2021-04-01 ENCOUNTER — Ambulatory Visit: Payer: 59 | Attending: Family Medicine | Admitting: Occupational Therapy

## 2021-04-01 DIAGNOSIS — R41840 Attention and concentration deficit: Secondary | ICD-10-CM | POA: Diagnosis present

## 2021-04-01 DIAGNOSIS — R278 Other lack of coordination: Secondary | ICD-10-CM | POA: Diagnosis present

## 2021-04-01 DIAGNOSIS — R41844 Frontal lobe and executive function deficit: Secondary | ICD-10-CM | POA: Insufficient documentation

## 2021-04-01 DIAGNOSIS — F840 Autistic disorder: Secondary | ICD-10-CM | POA: Insufficient documentation

## 2021-04-03 NOTE — Therapy (Signed)
[place] [place], [place], [address] Phone: [phone]   Fax:  [phone]  Occupational Therapy Treatment  Patient Details  Name: Mandy Whitehead MRN: [id number] Date of Birth: 2000-10-21 Referring Provider (OT): Uvaldo Bristle, MD (PCP)   Encounter Date: 04/01/2021      OT End of Session - 04/01/21 1422       Visit Number 3    Date for OT Re-Evaluation 06/16/21     Authorization Type [employer]     Authorization - Visit Number 3    Authorization - Number of Visits 30   per calendar year    OT Start Time 8315    OT Stop Time 1406    OT Time Calculation (min) 50 min    Activity Tolerance Patient tolerated treatment well     Behavior During Therapy [facility] for tasks assessed/performed            No past medical history on file.  No past surgical history on file.  There were no vitals filed for this visit.     Subjective Assessment - 04/01/21 1319      Subjective  Pt reported that adjusting the light to sleep in a darker room actually helped her get better sleep through the night!    Patient is accompanied by: Family member   Mother [name])    Pertinent History Autism spectrum disorder and delayed social and emotional development; PMH includes major depressive disorder, anxiety, scoliosis of thoracic spine, obesity, hirsutism, and OCD     Patient Stated Goals Sensitivity to light; getting to sleep     Currently in Pain? No/denies              OT Education - 04/01/21 1531      Education Details Provided education on [employer] organization and other examples of resources available to pt    Person(s) Education Patient; Parent(s)    Methods Explanation    Comprehension Verbalized understanding             Treatment/Exercises - 04/01/21      ADLs Reviewed success w/ previously discussed compensatory strategies and adaptations to improve sleep participation. Focus this session was on problem-solving and  developing a routine for managing changing of sheets to incorporate into weekly routine. OT also facilitated introductory conversation regarding decision-making for meal prep and clothing.     Handwriting Shared findings and relevant EBP related to reported difficulty w/ handwriting activities; OT provided options for pt to practice at home and additional strategies that may be beneficial to incorporate into practice            OT Short Term Goals - 03/25/21 1321       OT SHORT TERM GOAL #1   Title Pt will be able to identify at least 3 new activities to participate in to decrease impact of sensory input    Baseline Pt to complete Sensory Diet Exploration: Activity Checklist    Time 4    Period Weeks    Status On-going    Target Date 04/15/21             OT Long Term Goals - 03/25/21 1325       OT LONG TERM GOAL #1   Title Handwriting goal -- TBD    Baseline Reports difficulty w/ handwriting; to be further assessed    Time 8    Period Weeks    Status On-going    Target Date 05/13/21  OT LONG TERM GOAL #2   Title Pt will experience improved participation in sleep as evidenced by decreasing score on PROMIS - Sleep Disturbance short form by d/c    Baseline Raw sum: 34    Time 8    Period Weeks    Status On-going    Target Date 05/13/21      OT LONG TERM GOAL #3   Title Pt will improve success w/ small manipulatives (e.g., clothing fasteners) as evidenced by decreasing 9-Hole Peg Test time by at least 3 sec w/ each hand    Baseline 25 sec w/ R hand; 28 sec w/ L hand    Time 8    Period Weeks    Status On-going    Target Date 05/13/21      OT LONG TERM GOAL #4   Title Pt will report being able to select an outfit w/ Mod I, using compensatory strategies or AE prn, at least 50% of the time by d/c    Baseline Does not participate in outfit selection, per parent report    Time 8    Period Weeks    Status On-going    Target Date 05/13/21               Plan -  04/01/21 1429       Clinical Impression Statement Pt arrived with completed sensory diet checklist w/ OT reviewing selections w/ pt and discussing ways to incorporate activities into daily activities prn. Pt also reported that adjusting light was actually helpful in providing restful sleep! Due to this, OT continued to facilitate problem-solving regarding options to decrease and manage other affecting sensory input during sleep. OT also discussed potential benefit of addressing handwriting in OT, w/ pt reporting she would prefer to work on this by herself. Due to reported success and understanding of sensory compensatory strategies and adaptations, OT then facilitated discussion regarding options to improve decision-making processes. Pt did report some discomfort when discussing decision-making regarding food preferences and OT provided education regarding purpose and role of OT and encouraged pt that OT is here to help. OT also reiterated that pt is free to discuss as much or as little as she is comfortable with; pt was receptive and verbalized understanding.    OT Occupational Profile and History Problem Focused Assessment - Including review of records relating to presenting problem     Occupational performance deficits (Please refer to evaluation for details): ADL's;IADL's;Rest and Sleep;Leisure;Social Participation     Body Structure / Function / Physical Skills ADL;IADL;Coordination;FMC;Dexterity;Sensation     Cognitive Skills Attention;Problem Solve     Psychosocial Skills Coping Strategies;Interpersonal Interaction;Routines and Behaviors;Environmental  Adaptations;Habits     Rehab Potential Good     Clinical Decision Making Several treatment options, min-mod task modification necessary     Comorbidities Affecting Occupational Performance: Presence of comorbidities impacting occupational performance     Modification or Assistance to Complete Evaluation  No modification of tasks or assist necessary  to complete eval     OT Frequency 1x / week     OT Duration 8 weeks     OT Treatment/Interventions Self-care/ADL training;Therapeutic exercise;Coping strategies training;Patient/family education;Psychosocial skills training;Cognitive remediation/compensation;DME and/or AE instruction;Energy conservation;Therapeutic activities     Plan Introduce strategies/tools to help with problem-solving (starting w/ choosing clothes to wear)    Consulted and Agree with Plan of Care Patient;Family member/caregiver     Family Member Consulted Mother [name])  Patient will benefit from skilled therapeutic intervention in order to improve the following deficits and impairments:   Body Structure / Function / Physical Skills: ADL, IADL, Coordination, FMC, Dexterity, Sensation Cognitive Skills: Attention, Problem Solve Psychosocial Skills: Coping Strategies, Interpersonal Interaction, Routines and Behaviors, Environmental  Adaptations, Habits   Visit Diagnosis: Autism spectrum disorder  Frontal lobe and executive function deficit  Attention and concentration deficit  Other lack of coordination   Problem List Patient Active Problem List   Diagnosis Date Noted   Current moderate episode of major depressive disorder without prior episode ([place]) 01/08/2021   Morbid obesity ([place]) 01/08/2021   Anxiety 01/08/2021   Iron deficiency anemia due to chronic blood loss 07/15/2019   Scoliosis of thoracic spine 07/11/2019   Hirsutism 07/10/2019   Seasonal allergic rhinitis due to pollen 07/10/2019   Autism spectrum disorder 12/18/2018   Obsessive-compulsive disorder 12/18/2018   Acne vulgaris 10/03/2016   BMI (body mass index), pediatric, 95-99% for age 59/11/2016   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    Kathrine Cords, MSOT, OTR/L 04/01/2021, 2:13 PM  [place]. [place], [place], [address] Phone: [phone]    Fax:  [phone]  Name: Mandy Whitehead MRN: [id number] Date of Birth: [birth year]

## 2021-04-08 ENCOUNTER — Ambulatory Visit: Payer: 59 | Admitting: Occupational Therapy

## 2021-04-15 ENCOUNTER — Other Ambulatory Visit: Payer: Self-pay

## 2021-04-15 ENCOUNTER — Ambulatory Visit: Payer: 59 | Admitting: Occupational Therapy

## 2021-04-15 DIAGNOSIS — R41844 Frontal lobe and executive function deficit: Secondary | ICD-10-CM

## 2021-04-15 DIAGNOSIS — F840 Autistic disorder: Secondary | ICD-10-CM

## 2021-04-15 DIAGNOSIS — R41840 Attention and concentration deficit: Secondary | ICD-10-CM

## 2021-04-16 NOTE — Therapy (Signed)
[place]. [place], [place], [address] Phone: [phone]   Fax:  [phone]  Occupational Therapy Treatment  Patient Details  Name: Mandy Whitehead MRN: [id number] Date of Birth: Sep 06, 2000 Referring Provider (OT): Uvaldo Bristle, MD (PCP)   Encounter Date: 04/15/2021   OT End of Session - 04/15/21 1352     Visit Number 4    Date for OT Re-Evaluation 06/16/21    Authorization Type [employer]    Authorization - Visit Number 2    Authorization - Number of Visits 30   per calendar year   OT Start Time 1318    OT Stop Time 1403    OT Time Calculation (min) 45 min    Activity Tolerance Patient tolerated treatment well    Behavior During Therapy [facility] for tasks assessed/performed            No past medical history on file.  No past surgical history on file.  There were no vitals filed for this visit.   Subjective Assessment - 04/15/21 1351     Subjective  Pt states she was at home alone most of last week and that picking out and cooking meals for herself went well!    Patient is accompanied by: Family member   Mother [name])   Pertinent History Autism spectrum disorder and delayed social and emotional development; PMH includes major depressive disorder, anxiety, scoliosis of thoracic spine, obesity, hirsutism, and OCD    Patient Stated Goals Sensitivity to light; getting to sleep    Currently in Pain? No/denies             Treatment/Exercises - 04/15/21    Dressing OT facilitated discussion related to increasing independence and participation in appropriate clothing selection. Pt walked OT through typical process of outfit selection; identified that most difficulty comes when having to select an alternate option when first option is inappropriate (weather, occasion, etc). Pt also reports she cuts tags out of all her clothes and often does not realize it has created a hole. OT discussed compensatory strategies and AE  to be trialed to decrease damage to shirts and minimize impact on selection. OT also discussed organization of clothes, working w/ pt to determine current method of organization and alternate options that may facilitate increased ease of selection. Potential benefit of choice boards were also introduced w/ OT providing a general example as well as pt-specific examples of how to incorporate compensatory strategy into ADL; to be further addressed next session.            OT Short Term Goals - 04/15/21 1354       OT SHORT TERM GOAL #1   Title Pt will be able to identify at least 3 new activities to participate in to decrease impact of sensory input    Baseline Pt to complete Sensory Diet Exploration: Activity Checklist    Time 4    Period Weeks    Status Partially Met    Target Date 04/15/21             OT Long Term Goals - 03/25/21 1325       OT LONG TERM GOAL #1   Title Handwriting goal -- TBD    Baseline Reports difficulty w/ handwriting; to be further assessed    Time 8    Period Weeks    Status On-going    Target Date 05/13/21      OT LONG TERM GOAL #2   Title  Pt will experience improved participation in sleep as evidenced by decreasing score on PROMIS - Sleep Disturbance short form by d/c    Baseline Raw sum: 34    Time 8    Period Weeks    Status On-going    Target Date 05/13/21      OT LONG TERM GOAL #3   Title Pt will improve success w/ small manipulatives (e.g., clothing fasteners) as evidenced by decreasing 9-Hole Peg Test time by at least 3 sec w/ each hand    Baseline 25 sec w/ R hand; 28 sec w/ L hand    Time 8    Period Weeks    Status On-going    Target Date 05/13/21      OT LONG TERM GOAL #4   Title Pt will report being able to select an outfit w/ Mod I, using compensatory strategies or AE prn, at least 50% of the time by d/c    Baseline Does not participate in outfit selection, per parent report    Time 8    Period Weeks    Status On-going     Target Date 05/13/21             Plan - 04/15/21 1357     Clinical Impression Statement OT facilitated discussion regarding selecting clothing appropriate to time of day, weather, and occasion, working w/ pt to identify what makes clothing selection difficult and determining options to improve independence and efficiency. Difficulty w/ clothing selection appears to be related to limitations w/ selecting clothing appropriately vs difficulty making decisions. OT discussed ways to decrease impact of removing tags from clothes, as well as potential benefit of incorporating a choice board and/or simple reorganization of clothes to increase appropriateness of initial clothing selection and to decrease confusion if needing to select an alternate option. Pt's mother also indicated interest in employment seeking and acquisition for pt, which will be further explored next session.    OT Occupational Profile and History Problem Focused Assessment - Including review of records relating to presenting problem    Occupational performance deficits (Please refer to evaluation for details): ADL's;IADL's;Rest and Sleep;Leisure;Social Participation    Body Structure / Function / Physical Skills ADL;IADL;Coordination;FMC;Dexterity;Sensation    Cognitive Skills Attention;Problem Solve    Psychosocial Skills Coping Strategies;Interpersonal Interaction;Routines and Behaviors;Environmental  Adaptations;Habits    Rehab Potential Good    Clinical Decision Making Several treatment options, min-mod task modification necessary    Comorbidities Affecting Occupational Performance: Presence of comorbidities impacting occupational performance    Modification or Assistance to Complete Evaluation  No modification of tasks or assist necessary to complete eval    OT Frequency 1x / week    OT Duration 8 weeks    OT Treatment/Interventions Self-care/ADL training;Therapeutic exercise;Coping strategies training;Patient/family  education;Psychosocial skills training;Cognitive remediation/compensation;DME and/or AE instruction;Energy conservation;Therapeutic activities    Plan Work on Associate Professor for [employer] and Agree with Plan of Care Patient;Family member/caregiver    Family Member Consulted Mother [name])            Patient will benefit from skilled therapeutic intervention in order to improve the following deficits and impairments:   Body Structure / Function / Physical Skills: ADL, IADL, Coordination, FMC, Dexterity, Sensation Cognitive Skills: Attention, Problem Solve Psychosocial Skills: Coping Strategies, Interpersonal Interaction, Routines and Behaviors, Environmental  Adaptations, Habits   Visit Diagnosis: Autism spectrum disorder  Frontal lobe and executive function deficit  Attention and concentration deficit   Problem  List Patient Active Problem List   Diagnosis Date Noted   Current moderate episode of major depressive disorder without prior episode ([place]) 01/08/2021   Morbid obesity ([place]) 01/08/2021   Anxiety 01/08/2021   Iron deficiency anemia due to chronic blood loss 07/15/2019   Scoliosis of thoracic spine 07/11/2019   Hirsutism 07/10/2019   Seasonal allergic rhinitis due to pollen 07/10/2019   Autism spectrum disorder 12/18/2018   Obsessive-compulsive disorder 12/18/2018   Acne vulgaris 10/03/2016   BMI (body mass index), pediatric, 95-99% for age 39/11/2016   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    Kathrine Cords, MSOT, OTR/L 04/15/2021, 1:59 PM  [place]. [place], [place], [address] Phone: [phone]   Fax:  [phone]  Name: Salina Stanfield MRN: [id number] Date of Birth: 05-Jan-2001

## 2021-04-28 ENCOUNTER — Ambulatory Visit (INDEPENDENT_AMBULATORY_CARE_PROVIDER_SITE_OTHER): Payer: 59 | Admitting: Clinical

## 2021-04-28 DIAGNOSIS — F840 Autistic disorder: Secondary | ICD-10-CM | POA: Diagnosis not present

## 2021-04-28 NOTE — Progress Notes (Signed)
Diagnosis: F84.0, F41.9 Time of Session: 1:00PM-1:50pm CPT code: [id number]-[id number]  A'Nya was seen remotely using secure video conferencing. She was in her home and the therapist was in her home at the time of the appointment. She shared that she had started occupational therapy and had found it very helpful. Session focused on her sense that she should reach out more in her relationships. She created a plan to set a recurring reminder in her phone for every two weeks to send a text. She indicated a plan to try this, and is scheduled to be seen again in one month.  Objectives Related Problem: Neelam frequently overthinks situations and experiences frequent excessive worry Description: Teila would like to increase her ability to remain positive in stress inducing situations Target Date: 2021-12-02 Frequency: Biweekly Modality: individual Progress: 0% Planned Intervention: Dlisa will have opportunities to process experiences in session  Planned Intervention: Therapist will provide referrals and recommendations for additional resources as appropriate  Planned Intervention: Therapist will help Tory to identify and disengage from maladaptive thoughts and behaviors using CBT based strategies  Related Problem: Amaka's primary coping strategy has been repetitive pacing, which has been hurting her feet Description: Geraldean would like to decrease the frequency and duration of repetitive pacing Target Date: 2021-12-02 Frequency: Biweekly Modality: individual Progress: 0% Planned Intervention: Therapist will help Malika consider strategies to manage overwhelming sensory experiences  Treatment Plan Client Abilities/Strengths  Selda was able to describe the challenges bringing her to therapy and appeared motivated to work on them. She described herself as punctual and supportive of others. She shared that she is learning to be more talkative.  Client Treatment Preferences  Finola prefers virtual therapy and is also open to  in-person therapy.  Client Statement of Needs  Raylin is seeking cognitive and behavioral therapy to address symptoms of anxiety and depression. She would like to develop additional coping strategies to repetitive pacing, which she does to a degree that has been causing foot pain.  Treatment Level  Biweekly/Three weeks  Symptoms  Anxiety : excessive worry, avoidance of situations that provoke anxiety, difficulty with changes in plan and routine, perceived difficulty breathing, possible panic attacks (Status: maintained). Depression: lack of motivation, loss of pleasure in previously enjoyed activities (Status: maintained).  Problems Addressed  New Description, New Description  Goals 1. Chandrea frequently overthinks situations and experiences frequent excessive worry Objective Emmogene would like to increase her ability to remain positive in stress inducing situations  Target Date: 2021-12-02 Frequency: Biweekly  Progress: 0 Modality: individual  Related Interventions Therapist will provide referrals and recommendations for additional resources as appropriate  Therapist will engage Saffron in gradual exposure therapy as appropriate  Therapist will help Aadhya to identify and disengage from maladaptive thoughts and behaviors using CBT based strategies Skyann will have opportunities to process experiences in session 2. 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

## 2021-04-29 ENCOUNTER — Ambulatory Visit: Payer: 59 | Attending: Family Medicine | Admitting: Occupational Therapy

## 2021-04-29 ENCOUNTER — Other Ambulatory Visit: Payer: Self-pay

## 2021-04-29 DIAGNOSIS — F840 Autistic disorder: Secondary | ICD-10-CM | POA: Insufficient documentation

## 2021-04-29 DIAGNOSIS — R41844 Frontal lobe and executive function deficit: Secondary | ICD-10-CM | POA: Diagnosis present

## 2021-04-29 DIAGNOSIS — R41840 Attention and concentration deficit: Secondary | ICD-10-CM | POA: Diagnosis present

## 2021-04-30 NOTE — Therapy (Signed)
[address] [place], [place], [address] Phone: [phone]   Fax:  [phone]  Occupational Therapy Treatment  Patient Details  Name: Mandy Whitehead MRN: [id number] Date of Birth: 02-Sep-2000 Referring Provider (OT): Uvaldo Bristle, MD (PCP)   Encounter Date: 04/29/2021   OT End of Session - 04/29/21 1413     Visit Number 5    Date for OT Re-Evaluation 06/16/21    Authorization Type [employer]    Authorization - Visit Number 3    Authorization - Number of Visits 30   per calendar year   OT Start Time 1322    OT Stop Time 1403    OT Time Calculation (min) 41 min    Activity Tolerance Patient tolerated treatment well    Behavior During Therapy [facility] for tasks assessed/performed            No past medical history on file.  No past surgical history on file.  There were no vitals filed for this visit.   Subjective Assessment - 04/29/21 1325     Subjective  Pt reports she often does not go to appts or other meetings alone because she feels like she "misses things" or does not remember when/if there is something specific she needs to address    Patient is accompanied by: Family member   Mother [name])   Pertinent History Autism spectrum disorder and delayed social and emotional development; PMH includes major depressive disorder, anxiety, scoliosis of thoracic spine, obesity, hirsutism, and OCD    Patient Stated Goals Sensitivity to light; getting to sleep    Currently in Pain? No/denies            Treatment/Exercises - 04/29/21    ADLs: Clothing Selection OT and pt discussed typical process of outfit selection and problem-solved to identify compensatory/adaptive strategies to decrease the need to change her outfit. Strategies discussed included developing a checklist positioned near where pt selects clothing to identify issues like holes, stains, wrinkles, etc.; pre-selecting clothes for the week based on scheduled  events/appts and/or the weather; and developing a choice board to assist w/ selecting needed clothes to dress appropriately for the weather.            OT Short Term Goals - 04/15/21 1354       OT SHORT TERM GOAL #1   Title Pt will be able to identify at least 3 new activities to participate in to decrease impact of sensory input    Baseline Pt to complete Sensory Diet Exploration: Activity Checklist    Time 4    Period Weeks    Status Partially Met    Target Date 04/15/21             OT Long Term Goals - 03/25/21 1325       OT LONG TERM GOAL #1   Title Handwriting goal -- TBD    Baseline Reports difficulty w/ handwriting; to be further assessed    Time 8    Period Weeks    Status On-going    Target Date 05/13/21      OT LONG TERM GOAL #2   Title Pt will experience improved participation in sleep as evidenced by decreasing score on PROMIS - Sleep Disturbance short form by d/c    Baseline Raw sum: 34    Time 8    Period Weeks    Status On-going    Target Date 05/13/21      OT LONG  TERM GOAL #3   Title Pt will improve success w/ small manipulatives (e.g., clothing fasteners) as evidenced by decreasing 9-Hole Peg Test time by at least 3 sec w/ each hand    Baseline 25 sec w/ R hand; 28 sec w/ L hand    Time 8    Period Weeks    Status On-going    Target Date 05/13/21      OT LONG TERM GOAL #4   Title Pt will report being able to select an outfit w/ Mod I, using compensatory strategies or AE prn, at least 50% of the time by d/c    Baseline Does not participate in outfit selection, per parent report    Time 8    Period Weeks    Status On-going    Target Date 05/13/21             Plan - 04/29/21 1429     Clinical Impression Statement OT continued to facilitate discussion regarding increasing independence and participation in appropriate clothing selection, building off problem-solving and adaptive techniques discussed in previous session. Difficulty lies w/  selecting appropriate clothing (based on weather, occasion) w/ OT working w/ pt and her mother to identify strategies that pt is able to implement to decrease "errors" when initially selecting clothing. Ideally, trailing various strategies and implementing determined strategies that are effective will decrease the need to select a second outfit, and ultimately increase independence and confidence w/ decision-making process.    OT Occupational Profile and History Problem Focused Assessment - Including review of records relating to presenting problem    Occupational performance deficits (Please refer to evaluation for details): ADL's;IADL's;Rest and Sleep;Leisure;Social Participation    Body Structure / Function / Physical Skills ADL;IADL;Coordination;FMC;Dexterity;Sensation    Cognitive Skills Attention;Problem Solve    Psychosocial Skills Coping Strategies;Interpersonal Interaction;Routines and Behaviors;Environmental  Adaptations;Habits    Rehab Potential Good    Clinical Decision Making Several treatment options, min-mod task modification necessary    Comorbidities Affecting Occupational Performance: Presence of comorbidities impacting occupational performance    Modification or Assistance to Complete Evaluation  No modification of tasks or assist necessary to complete eval    OT Frequency 1x / week    OT Duration 8 weeks    OT Treatment/Interventions Self-care/ADL training;Therapeutic exercise;Coping strategies training;Patient/family education;Psychosocial skills training;Cognitive remediation/compensation;DME and/or AE instruction;Energy conservation;Therapeutic activities    Plan Review narrowing down clothes, selecting clothes in advance, developing choice board based on weather/occasion; practice w/ clothing ripper; resources for potential job opportunities    Consulted and Agree with Plan of Care Patient;Family member/caregiver    Family Member Consulted Mother [name])             Patient will benefit from skilled therapeutic intervention in order to improve the following deficits and impairments:   Body Structure / Function / Physical Skills: ADL, IADL, Coordination, FMC, Dexterity, Sensation Cognitive Skills: Attention, Problem Solve Psychosocial Skills: Coping Strategies, Interpersonal Interaction, Routines and Behaviors, Environmental  Adaptations, Habits   Visit Diagnosis: Autism spectrum disorder  Frontal lobe and executive function deficit  Attention and concentration deficit   Problem List Patient Active Problem List   Diagnosis Date Noted   Current moderate episode of major depressive disorder without prior episode ([place]) 01/08/2021   Morbid obesity ([place]) 01/08/2021   Anxiety 01/08/2021   Iron deficiency anemia due to chronic blood loss 07/15/2019   Scoliosis of thoracic spine 07/11/2019   Hirsutism 07/10/2019   Seasonal allergic rhinitis due to pollen 07/10/2019  Autism spectrum disorder 12/18/2018   Obsessive-compulsive disorder 12/18/2018   Acne vulgaris 10/03/2016   BMI (body mass index), pediatric, 95-99% for age 22/11/2016   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    Kathrine Cords, MSOT, OTR/L 04/29/2021, 2:59 PM  [address] [place], [place], [address] Phone: [phone]   Fax:  [phone]  Name: Mandy Whitehead MRN: [id number] Date of Birth: [birth year]

## 2021-05-05 ENCOUNTER — Ambulatory Visit: Payer: 59 | Admitting: Occupational Therapy

## 2021-05-13 ENCOUNTER — Ambulatory Visit: Payer: 59 | Admitting: Occupational Therapy

## 2021-05-13 ENCOUNTER — Other Ambulatory Visit: Payer: Self-pay

## 2021-05-13 DIAGNOSIS — R41844 Frontal lobe and executive function deficit: Secondary | ICD-10-CM

## 2021-05-13 DIAGNOSIS — R41840 Attention and concentration deficit: Secondary | ICD-10-CM

## 2021-05-13 DIAGNOSIS — F840 Autistic disorder: Secondary | ICD-10-CM | POA: Diagnosis not present

## 2021-05-14 NOTE — Therapy (Signed)
[address]. [place], [place], [address] Phone: [phone]   Fax:  [phone]  Occupational Therapy Treatment  Patient Details  Name: Mandy Whitehead MRN: [id number] Date of Birth: 2000/12/31 Referring Provider (OT): Uvaldo Bristle, MD (PCP)   Encounter Date: 05/13/2021   OT End of Session - 05/13/21 1703     Visit Number 6    Date for OT Re-Evaluation 06/16/21    Authorization Type U[employer]    Authorization - Visit Number 3    Authorization - Number of Visits 30   per calendar year   OT Start Time 1856    OT Stop Time 1357    OT Time Calculation (min) 42 min    Activity Tolerance Patient tolerated treatment well    Behavior During Therapy [facility] for tasks assessed/performed            No past medical history on file.  No past surgical history on file.  There were no vitals filed for this visit.   Subjective Assessment - 05/13/21 1702     Subjective  Pt reports she went ice skating w/ a friend the other day and had a great time, but may have been overstimulated after because she had what she thinks may have been a "panic attack" later that night   Patient is accompanied by: Family member   Mother Mandy Whitehead)   Pertinent History Autism spectrum disorder and delayed social and emotional development; PMH includes major depressive disorder, anxiety, scoliosis of thoracic spine, obesity, hirsutism, and OCD    Patient Stated Goals Sensitivity to light; getting to sleep    Currently in Pain? No/denies             OT Treatment - 05/13/21    IADLs: Social Participation OT reviewed previously discussed sensory diet related to different types of sensory input and how to incorporate sensory activities to prevent overstimulation as well as encourage self-regulation and arousal modulation. Strategies discussed include sensory swing, weighted blanket/proprioceptive input, fidget, and headphones/calming music; pt verbalized  understanding.    ADLs: Counsellor OT worked w/ pt to develop a Energy manager for clothing selection based on previously identified limitations to independence and participation in appropriate selection (e.g., weather, occasion). OT developed sample handout for pt to trial w/ sections for each day of the week, AM and PM weather, options for tops and for bottoms, as well as an additional section for special events or considerations.            OT Short Term Goals - 05/13/21 1704       OT SHORT TERM GOAL #1   Title Pt will be able to identify at least 3 new activities to participate in to decrease impact of sensory input    Baseline Pt to complete Sensory Diet Exploration: Activity Checklist    Time 4    Period Weeks    Status Achieved   05/13/21   Target Date 04/15/21             OT Long Term Goals - 03/25/21 1325       OT LONG TERM GOAL #1   Title Handwriting goal -- TBD    Baseline Reports difficulty w/ handwriting; to be further assessed    Time 8    Period Weeks    Status On-going    Target Date 05/13/21      OT LONG TERM GOAL #2   Title Pt will experience improved participation in  sleep as evidenced by decreasing score on PROMIS - Sleep Disturbance short form by d/c    Baseline Raw sum: 34    Time 8    Period Weeks    Status On-going    Target Date 05/13/21      OT LONG TERM GOAL #3   Title Pt will improve success w/ small manipulatives (e.g., clothing fasteners) as evidenced by decreasing 9-Hole Peg Test time by at least 3 sec w/ each hand    Baseline 25 sec w/ R hand; 28 sec w/ L hand    Time 8    Period Weeks    Status On-going    Target Date 05/13/21      OT LONG TERM GOAL #4   Title Pt will report being able to select an outfit w/ Mod I, using compensatory strategies or AE prn, at least 50% of the time by d/c    Baseline Does not participate in outfit selection, per parent report    Time 8    Period Weeks    Status On-going    Target Date  05/13/21             Plan - 05/13/21 1704     Clinical Impression Statement OT continued to address compensatory strategies related to increasing both independence and success w/ selecting clothing appropriate for time of day, weather, occasion, etc. Due to pt's report that she has been successful w/ trying to be more aware and proactive regarding checking the weather prior to selecting an outfit, OT worked w/ pt to develop a simple choice board for her to place in a convenient/accessible area to help w/ planning and organization of clothing selection. OT also discussed potential of pt going on-hold after next session to allow time for her to trial recommended strategies and get settled into new house, as pt reports her family is in the process of moving; pt is agreeable to plan at this time.    OT Occupational Profile and History Problem Focused Assessment - Including review of records relating to presenting problem    Occupational performance deficits (Please refer to evaluation for details): ADL's;IADL's;Rest and Sleep;Leisure;Social Participation    Body Structure / Function / Physical Skills ADL;IADL;Coordination;FMC;Dexterity;Sensation    Cognitive Skills Attention;Problem Solve    Psychosocial Skills Coping Strategies;Interpersonal Interaction;Routines and Behaviors;Environmental  Adaptations;Habits    Rehab Potential Good    Clinical Decision Making Several treatment options, min-mod task modification necessary    Comorbidities Affecting Occupational Performance: Presence of comorbidities impacting occupational performance    Modification or Assistance to Complete Evaluation  No modification of tasks or assist necessary to complete eval    OT Frequency 1x / week    OT Duration 8 weeks    OT Treatment/Interventions Self-care/ADL training;Therapeutic exercise;Coping strategies training;Patient/family education;Psychosocial skills training;Cognitive remediation/compensation;DME and/or AE  instruction;Energy conservation;Therapeutic activities    Plan Review "choice board"; practice w/ clothing ripper; resources for potential job opportunities    Consulted and Agree with Plan of Care Patient;Family member/caregiver    Family Member Consulted Mother Mandy Whitehead)            Patient will benefit from skilled therapeutic intervention in order to improve the following deficits and impairments:   Body Structure / Function / Physical Skills: ADL, IADL, Coordination, FMC, Dexterity, Sensation Cognitive Skills: Attention, Problem Solve Psychosocial Skills: Coping Strategies, Interpersonal Interaction, Routines and Behaviors, Environmental  Adaptations, Habits   Visit Diagnosis: Autism spectrum disorder  Frontal lobe and executive function deficit  Attention and concentration deficit   Problem List Patient Active Problem List   Diagnosis Date Noted   Current moderate episode of major depressive disorder without prior episode ([place]) 01/08/2021   Morbid obesity ([place]) 01/08/2021   Anxiety 01/08/2021   Iron deficiency anemia due to chronic blood loss 07/15/2019   Scoliosis of thoracic spine 07/11/2019   Hirsutism 07/10/2019   Seasonal allergic rhinitis due to pollen 07/10/2019   Autism spectrum disorder 12/18/2018   Obsessive-compulsive disorder 12/18/2018   Acne vulgaris 10/03/2016   BMI (body mass index), pediatric, 95-99% for age 56/11/2016   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    Kathrine Cords, MSOT, OTR/L 05/13/2021, 5:05 PM  [address]. [place], [place], [address] Phone: [phone]   Fax:  [phone]  Name: Mattison Golay MRN: [id number] Date of Birth: 2000-04-22

## 2021-05-20 ENCOUNTER — Other Ambulatory Visit: Payer: Self-pay

## 2021-05-20 ENCOUNTER — Ambulatory Visit: Payer: 59 | Admitting: Occupational Therapy

## 2021-05-20 ENCOUNTER — Encounter: Payer: Self-pay | Admitting: Occupational Therapy

## 2021-05-20 DIAGNOSIS — R41840 Attention and concentration deficit: Secondary | ICD-10-CM

## 2021-05-20 DIAGNOSIS — F840 Autistic disorder: Secondary | ICD-10-CM | POA: Diagnosis not present

## 2021-05-20 DIAGNOSIS — R41844 Frontal lobe and executive function deficit: Secondary | ICD-10-CM

## 2021-05-21 NOTE — Therapy (Signed)
[address]. [place], [place], [address] Phone: [phone]   Fax:  [phone]  Occupational Therapy Treatment  Patient Details  Name: Mandy Whitehead MRN: [id number] Date of Birth: 02-Apr-2000 Referring Provider (OT): Uvaldo Bristle, MD (PCP)   Encounter Date: 05/20/2021   OT End of Session - 05/20/21 1411     Visit Number 7    Date for OT Re-Evaluation 06/16/21    Authorization Type U[employer]    Authorization - Visit Number 5    Authorization - Number of Visits 30   per calendar year   OT Start Time 2336    OT Stop Time 1445    OT Time Calculation (min) 38 min    Activity Tolerance Patient tolerated treatment well    Behavior During Therapy [facility] for tasks assessed/performed            History reviewed. No pertinent past medical history.  History reviewed. No pertinent surgical history.  There were no vitals filed for this visit.   Subjective Assessment - 05/20/21 1409     Subjective  Pt reports her and her family have moved into the new house, but are continuing to bring things over from their apartment    Patient is accompanied by: Family member   Mother [name])   Pertinent History Autism spectrum disorder and delayed social and emotional development; PMH includes major depressive disorder, anxiety, scoliosis of thoracic spine, obesity, hirsutism, and OCD    Patient Stated Goals Sensitivity to light; getting to sleep    Currently in Pain? No/denies             OT Treatment - 05/20/21    ADLs/Self-Care OT reviewed benefit of sensory diet checklist in regards to identifying options of activities to participate in to decrease impact of disruptive stimuli or overstimulation and promote regulation of response. OT also discussed benefit of incorporating helpful/preferred sensory diet activities prior to participation in specific events or activities (e.g., going to an amusement park) for prevention of  overstimulation. Pt verbalized understanding and identified activities including pacing, swinging, turning on LED lights, noise cancelling headphones, and preferred auditory input. OT further facilitated discussion regarding options for relevant modifications/compensatory strategies as well, including opening windows, going for a walk outside, etc. New printout of sensory diet checklist provided for pt's reference at conclusion of session.    ADLs: Dressing Practice w/ self-purchased fabric ripper to remove tags sewn into clothing. Pt was able to successfully remove tag w/ initial demonstration from OT and occasional cues for assist w/ problem-solving during task.            OT Short Term Goals - 05/13/21 1704       OT SHORT TERM GOAL #1   Title Pt will be able to identify at least 3 new activities to participate in to decrease impact of sensory input    Baseline Pt to complete Sensory Diet Exploration: Activity Checklist    Time 4    Period Weeks    Status Achieved   05/13/21   Target Date 04/15/21             OT Long Term Goals - 05/20/21 1506       OT LONG TERM GOAL #1   Title Handwriting goal -- TBD    Baseline Reports difficulty w/ handwriting; to be further assessed    Time 8    Period Weeks    Status On-going    Target Date 05/13/21  OT LONG TERM GOAL #2   Title Pt will experience improved participation in sleep as evidenced by decreasing score on PROMIS - Sleep Disturbance short form by d/c    Baseline Raw sum: 34    Time 8    Period Weeks    Status On-going    Target Date 05/13/21      OT LONG TERM GOAL #3   Title Pt will improve success w/ small manipulatives (e.g., clothing fasteners) as evidenced by decreasing 9-Hole Peg Test time by at least 3 sec w/ each hand    Baseline 25 sec w/ R hand; 28 sec w/ L hand    Time 8    Period Weeks    Status On-going    Target Date 05/13/21      OT LONG TERM GOAL #4   Title Pt will report being able to select an  outfit w/ Mod I, using compensatory strategies or AE prn, at least 50% of the time by d/c    Baseline Does not participate in outfit selection, per parent report    Time 8    Period Weeks    Status Achieved   05/20/21   Target Date 05/13/21             Plan - 05/20/21 1507     Clinical Impression Statement OT continued to facilitate discussion and identification/development of strategies, regulating activities, and different types of sensory input that pt will be able to use prevent overstimulation or for recovery when presented w/ distruptive stimuli. Pt encouraged to utilize these strategies/sensory diet in order to ultimately improve participation in daily activities, particularly w/ social events, leisure activities/events, and daily tasks. OT also encouraged pt to inquire about sensory rooms/sensory inclusion efforts when appropriate (e.g., sporting events, travel, amusement parks, etc.). Considering pt and her family are in the process of moving, pt requested to defer for a few weeks prior to returning to OP OT; pt to call when she is ready to return. OT encouraged pt to reach out w/ any questions or concerns during that time and to continue to practice learned techniques at home.    OT Occupational Profile and History Problem Focused Assessment - Including review of records relating to presenting problem    Occupational performance deficits (Please refer to evaluation for details): ADL's;IADL's;Rest and Sleep;Leisure;Social Participation    Body Structure / Function / Physical Skills ADL;IADL;Coordination;FMC;Dexterity;Sensation    Cognitive Skills Attention;Problem Solve    Psychosocial Skills Coping Strategies;Interpersonal Interaction;Routines and Behaviors;Environmental  Adaptations;Habits    Rehab Potential Good    Clinical Decision Making Several treatment options, min-mod task modification necessary    Comorbidities Affecting Occupational Performance: Presence of comorbidities  impacting occupational performance    Modification or Assistance to Complete Evaluation  No modification of tasks or assist necessary to complete eval    OT Frequency 1x / week    OT Duration 8 weeks    OT Treatment/Interventions Self-care/ADL training;Therapeutic exercise;Coping strategies training;Patient/family education;Psychosocial skills training;Cognitive remediation/compensation;DME and/or AE instruction;Energy conservation;Therapeutic activities    Plan Pt to be put on hold at this time   Consulted and Agree with Plan of Care Patient;Family member/caregiver    Family Member Consulted Mother [name])            Patient will benefit from skilled therapeutic intervention in order to improve the following deficits and impairments:   Body Structure / Function / Physical Skills: ADL, IADL, Coordination, FMC, Dexterity, Sensation Cognitive Skills: Attention, Problem Solve Psychosocial Skills:  Coping Strategies, Interpersonal Interaction, Routines and Behaviors, Environmental  Adaptations, Habits   Visit Diagnosis: Autism spectrum disorder  Frontal lobe and executive function deficit  Attention and concentration deficit   Problem List Patient Active Problem List   Diagnosis Date Noted   Current moderate episode of major depressive disorder without prior episode ([place]) 01/08/2021   Morbid obesity ([place]) 01/08/2021   Anxiety 01/08/2021   Iron deficiency anemia due to chronic blood loss 07/15/2019   Scoliosis of thoracic spine 07/11/2019   Hirsutism 07/10/2019   Seasonal allergic rhinitis due to pollen 07/10/2019   Autism spectrum disorder 12/18/2018   Obsessive-compulsive disorder 12/18/2018   Acne vulgaris 10/03/2016   BMI (body mass index), pediatric, 95-99% for age 25/11/2016   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    Kathrine Cords, MSOT, OTR/L  05/20/2021, 3:07 PM  [address]. [place], [place], [address] Phone: [phone]   Fax:  [phone]  Name: Mandy Whitehead MRN: [id number] Date of Birth: 08-Feb-2001

## 2021-05-26 ENCOUNTER — Ambulatory Visit (INDEPENDENT_AMBULATORY_CARE_PROVIDER_SITE_OTHER): Payer: 59 | Admitting: Clinical

## 2021-05-26 DIAGNOSIS — F840 Autistic disorder: Secondary | ICD-10-CM

## 2021-05-26 NOTE — Progress Notes (Signed)
Diagnosis: F84.0, F41.9 ?Time of Session: 8:00AM-8:50pm ?CPT code: 13244W-10 ? ?Mandy Whitehead was seen remotely using secure video conferencing. She was in her home and the therapist was in her home at the time of the appointment. She shared that her family was in the process of moving, and she had had two panic attacks since her last session. Therapist processed events leading to these panic attacks to identify early warning signs and consider how to use coping strategies if she notices these signs again. She also shared that she had come across her father's pocket knife, and had noticed unexpected thoughts of cutting herself, without plan or intent. She had told her mother of the thoughts and given her mother the knife. She found the thoughts scary and reported no intent toward self harm. She has only had thoughts like this once before, when she considered drinking bleach in high school. Therapist engaged her in the creation of a safety plan, which she agreed to follow. No further thoughts of self harm or suicidal ideation were endorsed. Therapist provided psychoeducation on intrusive thoughts. She is scheduled to be seen again in April and will reach out if she needs to be seen sooner.  ? ?Objectives ?Related Problem: Mandy Whitehead frequently overthinks situations and experiences frequent excessive worry ?Description: Mandy Whitehead would like to increase her ability to remain positive in stress inducing situations ?Target Date: 2021-12-02 ?Frequency: Biweekly ?Modality: individual ?Progress: 0% ?Planned Intervention: Mandy Whitehead will have opportunities to process experiences in session  ?Planned Intervention: Therapist will provide referrals and recommendations for additional resources as appropriate  ?Planned Intervention: Therapist will help Mandy Whitehead to identify and disengage from maladaptive thoughts and behaviors using CBT based strategies  ?Related Problem: Mandy Whitehead's primary coping strategy has been repetitive pacing, which has been hurting her  feet ?Description: Mandy Whitehead would like to decrease the frequency and duration of repetitive pacing ?Target Date: 2021-12-02 ?Frequency: Biweekly ?Modality: individual ?Progress: 0% ?Planned Intervention: Therapist will help Mandy Whitehead consider strategies to manage overwhelming sensory experiences  ?Treatment Plan ?Client Abilities/Strengths  ?Mandy Whitehead was able to describe the challenges bringing her to therapy and appeared motivated to work on them. She described herself as punctual and supportive of others. She shared that she is learning to be more talkative.  ?Client Treatment Preferences  ?Mandy Whitehead prefers virtual therapy and is also open to in-person therapy.  ?Client Statement of Needs  ?Mandy Whitehead is seeking cognitive and behavioral therapy to address symptoms of anxiety and depression. She would like to develop additional coping strategies to repetitive pacing, which she does to a degree that has been causing foot pain.  ?Treatment Level  ?Biweekly/Three weeks  ?Symptoms  ?Anxiety : excessive worry, avoidance of situations that provoke anxiety, difficulty with changes in plan and routine, perceived difficulty breathing, possible panic attacks (Status: maintained). Depression: lack of motivation, loss of pleasure in previously enjoyed activities (Status: maintained).  ?Problems Addressed  ?New Description, New Description  ?Goals ?1. Mandy Whitehead frequently overthinks situations and experiences frequent excessive worry ?Objective ?Mandy Whitehead would like to increase her ability to remain positive in stress inducing situations  ?Target Date: 2021-12-02 Frequency: Biweekly  ?Progress: 0 Modality: individual  ?Related Interventions ?Therapist will provide referrals and recommendations for additional resources as appropriate  ?Therapist will engage [name] in gradual exposure therapy as appropriate  ?Therapist will help Mandy Whitehead to identify and disengage from maladaptive thoughts and behaviors using CBT based strategies ?Mandy Whitehead will have opportunities to  process experiences in session ?2. 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 ? ? ? ? ?Myrtie Cruise, PhD ? ? ? ? ? ? ? ? ? ? ? ? ? ? ?Myrtie Cruise, PhD ? ? ? ? ? ? ? ? ? ? ? ? ? ? ?Myrtie Cruise, PhD ?

## 2021-06-07 ENCOUNTER — Other Ambulatory Visit: Payer: Self-pay | Admitting: Family Medicine

## 2021-06-24 ENCOUNTER — Ambulatory Visit: Payer: 59 | Attending: Family Medicine | Admitting: Occupational Therapy

## 2021-06-24 DIAGNOSIS — R41840 Attention and concentration deficit: Secondary | ICD-10-CM | POA: Diagnosis present

## 2021-06-24 DIAGNOSIS — R41844 Frontal lobe and executive function deficit: Secondary | ICD-10-CM | POA: Diagnosis present

## 2021-06-24 DIAGNOSIS — R278 Other lack of coordination: Secondary | ICD-10-CM

## 2021-06-24 DIAGNOSIS — F840 Autistic disorder: Secondary | ICD-10-CM | POA: Diagnosis present

## 2021-06-25 NOTE — Therapy (Signed)
?OUTPATIENT OCCUPATIONAL THERAPY TREATMENT NOTE ? ? ?Patient Name: Mandy Whitehead ?MRN: [id number] ?DOB:2000-10-19, 21 y.o., female ?Today's Date: 06/25/2021 ? ?PCP: Leamon Arnt, MD ?REFERRING PROVIDER: Leamon Arnt, MD ? ? OT End of Session - 06/24/21 1740   ? ? Visit Number 8   ? Date for OT Re-Evaluation 08/23/21   ? Authorization Type UHC   ? Authorization - Visit Number 6   ? Authorization - Number of Visits 30   per calendar year  ? OT Start Time 1103   ? OT Stop Time 1142   ? OT Time Calculation (min) 39 min   ? Activity Tolerance Patient tolerated treatment well   ? Behavior During Therapy [facility] for tasks assessed/performed   ? ?  ?  ? ?  ? ?No past medical history on file. ?No past surgical history on file. ? ?Patient Active Problem List  ? Diagnosis Date Noted  ? Current moderate episode of major depressive disorder without prior episode ([place]) 01/08/2021  ? Morbid obesity ([place]) 01/08/2021  ? Anxiety 01/08/2021  ? Iron deficiency anemia due to chronic blood loss 07/15/2019  ? Scoliosis of thoracic spine 07/11/2019  ? Hirsutism 07/10/2019  ? Seasonal allergic rhinitis due to pollen 07/10/2019  ? Autism spectrum disorder 12/18/2018  ? Obsessive-compulsive disorder 12/18/2018  ? Acne vulgaris 10/03/2016  ? BMI (body mass index), pediatric, 95-99% for age 35/11/2016  ? 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 ? ?ONSET DATE: N/A ? ?REFERRING DIAG: ?F84.0 (ICD-10-CM) - Autism spectrum disorder ?F88 (ICD-10-CM) - Delayed social and emotional development  ? ?THERAPY DIAG:  ?Autism spectrum disorder ? ?Frontal lobe and executive function deficit ? ?Attention and concentration deficit ? ?Other lack of coordination ? ? ?PERTINENT HISTORY: PMH includes major depressive disorder, anxiety, scoliosis of thoracic spine, obesity, hirsutism, and OCD ? ?PRECAUTIONS: None ? ?SUBJECTIVE: Pt reports her and her family have moved into their new house and she would like to resume OP therapy to continue to improve  independence, hopefully get her drivers license, and find groups/programs or hobbies that she is interested in. ? ?PAIN:  ?Are you having pain? No ? ? ?OBJECTIVE:  ? ?TODAY'S TREATMENT:  ?OT facilitated discussion regarding leisure exploration, problem-solving w/ pt to identify examples of activities or tasks to try based on identified interests. Also discussed potential engagement in driving, focusing on developing actionable steps to initiate process of obtaining state driver license; pt wrote tasks down w/ intention to complete prior to next session. ? ? ?PATIENT EDUCATION: ?Education details: OT provided continued condition-specific education, particularly related to leisure pursuits and potential for job seeking and acquisition ?Person educated: Patient ?Education method: Explanation ?Education comprehension: verbalized understanding ? ? ?[place] ?None ? ? ? OT Short Term Goals  ? ? OT SHORT TERM GOAL #1  ? Title Pt will be able to identify at least 3 new activities to participate in to decrease impact of sensory input   ? Baseline Pt to complete Sensory Diet Exploration: Activity Checklist   ? Time 4   ? Period Weeks   ? Status Achieved   05/13/21  ? Target Date 04/15/21   ?  ? OT SHORT TERM GOAL #2  ? Title Pt will verbalize understanding of at least 2 new hobbies, social groups, or local community programs to improve leisure exploration and participation   ? Time 4   ? Period Weeks   ? Status New   ? Target Date 07/23/21   ? ?  ?  ? ?  OT Long Term Goals  ? ? OT LONG TERM GOAL #1  ? Title Pt will verbalize understanding of vocational rehab services or other additional support programs to facilitate success w/ identifying employment interests and w/ potential employment seeking   ? Time 8   ? Period Weeks   ? Status New  ? Target Date 08/20/21   ?  ? OT LONG TERM GOAL #2  ? Title Pt will experience improved participation in sleep as evidenced by decreasing score on PROMIS - Sleep Disturbance short  form by d/c   ? Baseline Raw sum: 34   ? Time 8   ? Period Weeks   ? Status Achieved   06/24/21 - 31  ?  ? OT LONG TERM GOAL #3  ? Title Pt will improve success w/ small manipulatives (e.g., clothing fasteners) as evidenced by decreasing 9-Hole Peg Test time by at least 3 sec w/ each hand   ? Baseline 25 sec w/ R hand; 28 sec w/ L hand   ? Time 8   ? Period Weeks   ? Status On-going   ? Target Date 08/20/21   ?  ? OT LONG TERM GOAL #4  ? Title Pt will report being able to select an outfit w/ Mod I, using compensatory strategies or AE prn, at least 50% of the time by d/c   ? Baseline Does not participate in outfit selection, per parent report   ? Time 8   ? Period Weeks   ? Status Achieved   05/20/21  ?  ? OT LONG TERM GOAL #5  ? Title Pt will be able to demonstrate completing simulated shopping activity w/ Mod I, using compensatory strategies prn, to improve independence   ? Time 8   ? Period Weeks   ? Status New   ? Target Date 08/20/21   ?  ? OT LONG TERM GOAL #6  ? Title Pt will be able to complete 2 tasks related to obtaining a driver license w/ Mod I to improve independence w/ community mobility   ? Time 8   ? Period Weeks   ? Status New   ? Target Date 08/20/21   ? ?  ?  ? ? Plan  ? ? Clinical Impression Statement Pt returns to OP OT after being on-hold from therapy for 5 weeks to allow pt and her family time to relocate to a new house, as well as assess success w/ learned/previously discussed compensatory or adaptive strategies. Considering time spent away from therapy, OT completed informal re-evaluation this session, facilitating thorough discussion related to pt's level of independence w/ necessary and desired tasks, as well as her ultimate goals for therapy. Pt continues to express want to increase independence, but is not quite ready to pursue job seeking/acquisition at this time. She would like to focus on obtaining her driver license and identifying hobbies or groups/programs to engage in. Pt will  benefit from continued skilled OT services to address overall independence w/ IADLs and improve sense of well-being.  ? OT Occupational Profile and History Problem Focused Assessment - Including review of records relating to presenting problem   ? Occupational performance deficits (Please refer to evaluation for details): ADL's;IADL's;Rest and Sleep;Leisure;Social Participation   ? Body Structure / Function / Physical Skills ADL;IADL;Coordination;FMC;Dexterity;Sensation   ? Cognitive Skills Attention;Problem Solve   ? Psychosocial Skills Coping Strategies;Interpersonal Interaction;Routines and Behaviors;Environmental  Adaptations;Habits   ? Rehab Potential Good   ? Clinical Decision Making Several treatment  options, min-mod task modification necessary   ? Comorbidities Affecting Occupational Performance: Presence of comorbidities impacting occupational performance   ? Modification or Assistance to Complete Evaluation  No modification of tasks or assist necessary to complete eval   ? OT Frequency 1x / week   ? OT Duration 8 weeks   ? OT Treatment/Interventions Self-care/ADL training;Therapeutic exercise;Coping strategies training;Patient/family education;Psychosocial skills training;Cognitive remediation/compensation;DME and/or AE instruction;Energy conservation;Therapeutic activities   ? Plan Education related to support groups, local programs, volunteer opportunities, etc.; leisure exploration/pursuits   ? Consulted and Agree with Plan of Care Patient;Family member/caregiver   ? Family Member Consulted Mother [name])   ? ?  ?  ? ?  ?Kathrine Cords, MSOT, OTR/L  ?06/24/2021, 5:48 PM ?

## 2021-07-01 ENCOUNTER — Ambulatory Visit: Payer: 59 | Attending: Family Medicine | Admitting: Occupational Therapy

## 2021-07-01 DIAGNOSIS — R278 Other lack of coordination: Secondary | ICD-10-CM | POA: Diagnosis present

## 2021-07-01 DIAGNOSIS — R41844 Frontal lobe and executive function deficit: Secondary | ICD-10-CM | POA: Diagnosis present

## 2021-07-01 DIAGNOSIS — R41840 Attention and concentration deficit: Secondary | ICD-10-CM | POA: Diagnosis present

## 2021-07-01 DIAGNOSIS — F840 Autistic disorder: Secondary | ICD-10-CM | POA: Diagnosis present

## 2021-07-01 NOTE — Therapy (Signed)
?OUTPATIENT OCCUPATIONAL THERAPY TREATMENT NOTE ? ? ?Patient Name: Mandy Whitehead ?MRN: [id number] ?DOB:Nov 16, 2000, 21 y.o., female ?Today's Date: 07/01/2021 ? ?PCP: Leamon Arnt, MD ?REFERRING PROVIDER: Leamon Arnt, MD ? ? OT End of Session - 07/01/21 1323   ? ? Visit Number 9   ? Date for OT Re-Evaluation 08/23/21   ? Authorization Type UHC   ? Authorization - Visit Number 7   ? Authorization - Number of Visits 30   per calendar year  ? OT Start Time 1320   ? OT Stop Time 1402   ? OT Time Calculation (min) 42 min   ? Activity Tolerance Patient tolerated treatment well   ? Behavior During Therapy [facility] for tasks assessed/performed   ? ?  ?  ? ?  ?No past medical history on file. ?No past surgical history on file. ? ?Patient Active Problem List  ? Diagnosis Date Noted  ? Current moderate episode of major depressive disorder without prior episode ([place]) 01/08/2021  ? Morbid obesity ([place]) 01/08/2021  ? Anxiety 01/08/2021  ? Iron deficiency anemia due to chronic blood loss 07/15/2019  ? Scoliosis of thoracic spine 07/11/2019  ? Hirsutism 07/10/2019  ? Seasonal allergic rhinitis due to pollen 07/10/2019  ? Autism spectrum disorder 12/18/2018  ? Obsessive-compulsive disorder 12/18/2018  ? Acne vulgaris 10/03/2016  ? BMI (body mass index), pediatric, 95-99% for age 18/11/2016  ? 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 ? ?ONSET DATE: N/A ? ?REFERRING DIAG: ?F84.0 (ICD-10-CM) - Autism spectrum disorder ?F88 (ICD-10-CM) - Delayed social and emotional development  ? ?THERAPY DIAG:  ?Autism spectrum disorder ? ?Frontal lobe and executive function deficit ? ?Attention and concentration deficit ? ?Other lack of coordination ? ? ?PERTINENT HISTORY: PMH includes major depressive disorder, anxiety, scoliosis of thoracic spine, obesity, hirsutism, and OCD ? ?PRECAUTIONS: None ? ?SUBJECTIVE: ? ?SUBJECTIVE STATEMENT: ?Pt states she might be in to photography and cooking and was actually able to cook breakfast for her  parents this past week. Pt also reports she used her fabric ripper on a new dress and it worked! ? ?PAIN:  ?Are you having pain? No ? ? ?OBJECTIVE:  ? ?TODAY'S TREATMENT:  ?OT facilitated thorough discussion related to options for pursuing identified leisure interests/skills, including education-based course options, how to make decisions and find available resources or opportunities, and further planning/participation in leisure pursuits; pt verbalized understanding of strategies and will work to identify 2 potential options for increased participation/learning of preferred leisure activities for OT to assist w/ next session ?Practiced navigating a website to improve independence w/ identifying available resources or other community events/programs/volunteer opportunities; used [employer] [place] website to facilitate this w/ corresponding handout provided to pt at conclusion of session. Pt able to complete task w/ Mod I, appropriately navigating to relevant topic pages and searches ? ? ?PATIENT EDUCATION: ?Education details: See above ?Person educated: Patient ?Education method: Explanation, Demonstration, and Handouts ?Education comprehension: verbalized understanding ? ? ?[place] ?None ? ? ? OT Short Term Goals  ? ? OT SHORT TERM GOAL #1  ? Title Pt will be able to identify at least 3 new activities to participate in to decrease impact of sensory input   ? Baseline Pt to complete Sensory Diet Exploration: Activity Checklist   ? Time 4   ? Period Weeks   ? Status Achieved   05/13/21  ? Target Date 04/15/21   ?  ? OT SHORT TERM GOAL #2  ? Title Pt  will verbalize understanding of at least 2 new hobbies, social groups, or local community programs to improve leisure exploration and participation   ? Time 4   ? Period Weeks   ? Status In Progress   ? Target Date 07/23/21   ? ?  ?  ? ? OT Long Term Goals  ? ? OT LONG TERM GOAL #1  ? Title Pt will verbalize understanding of vocational rehab services or  other additional support programs to facilitate success w/ identifying employment interests and w/ potential employment seeking   ? Time 8   ? Period Weeks   ? Status In Progress  ? Target Date 08/20/21   ?  ? OT LONG TERM GOAL #2  ? Title Pt will experience improved participation in sleep as evidenced by decreasing score on PROMIS - Sleep Disturbance short form by d/c   ? Baseline Raw sum: 34   ? Time 8   ? Period Weeks   ? Status Achieved   06/24/21 - 31  ?  ? OT LONG TERM GOAL #3  ? Title Pt will improve success w/ small manipulatives (e.g., clothing fasteners) as evidenced by decreasing 9-Hole Peg Test time by at least 3 sec w/ each hand   ? Baseline 25 sec w/ R hand; 28 sec w/ L hand   ? Time 8   ? Period Weeks   ? Status In Progress  ? Target Date 08/20/21   ?  ? OT LONG TERM GOAL #4  ? Title Pt will report being able to select an outfit w/ Mod I, using compensatory strategies or AE prn, at least 50% of the time by d/c   ? Baseline Does not participate in outfit selection, per parent report   ? Time 8   ? Period Weeks   ? Status Achieved   05/20/21  ?  ? OT LONG TERM GOAL #5  ? Title Pt will be able to demonstrate completing simulated shopping activity w/ Mod I, using compensatory strategies prn, to improve independence   ? Time 8   ? Period Weeks   ? Status In Progress   ? Target Date 08/20/21   ?  ? OT LONG TERM GOAL #6  ? Title Pt will be able to complete 2 tasks related to obtaining a driver license w/ Mod I to improve independence w/ community mobility   ? Time 8   ? Period Weeks   ? Status In Progress   ? Target Date 08/20/21   ? ?  ?  ? ? Plan  ? ? Clinical Impression Statement Pt continues to make excellent progress toward goals and appears motivated by newly identified hobbies and leisure pursuits. OT focused session on narrowing down/problem-solving to select attainable options for more involved participation of these preferred activities. Also discussed provision of appropriate resources to seek out  new or additional opportunities, events, and support or further condition-specific education prn. Pt continues to benefit from therapy to assist w/ decision-making processes related to IADLs and continued condition-specific education.  ? OT Occupational Profile and History Problem Focused Assessment - Including review of records relating to presenting problem   ? Occupational performance deficits (Please refer to evaluation for details): ADL's;IADL's;Rest and Sleep;Leisure;Social Participation   ? Body Structure / Function / Physical Skills ADL;IADL;Coordination;FMC;Dexterity;Sensation   ? Cognitive Skills Attention;Problem Solve   ? Psychosocial Skills Coping Strategies;Interpersonal Interaction;Routines and Behaviors;Environmental  Adaptations;Habits   ? Rehab Potential Good   ? Clinical Decision Making Several  treatment options, min-mod task modification necessary   ? Comorbidities Affecting Occupational Performance: Presence of comorbidities impacting occupational performance   ? Modification or Assistance to Complete Evaluation  No modification of tasks or assist necessary to complete eval   ? OT Frequency 1x / week   ? OT Duration 8 weeks   ? OT Treatment/Interventions Self-care/ADL training;Therapeutic exercise;Coping strategies training;Patient/family education;Psychosocial skills training;Cognitive remediation/compensation;DME and/or AE instruction;Energy conservation;Therapeutic activities   ? Plan Review options for increased participation in photography and/or cooking; develop practicable/attainable steps for pursuing these options (or driver's licence)  ? Consulted and Agree with Plan of Care Patient;Family member/caregiver   ? Family Member Consulted Mother [name])   ? ?  ?  ? ?Kathrine Cords, MSOT, OTR/L  ?06/24/2021, 1:23 PM ?

## 2021-07-22 ENCOUNTER — Ambulatory Visit (INDEPENDENT_AMBULATORY_CARE_PROVIDER_SITE_OTHER): Payer: 59 | Admitting: Clinical

## 2021-07-22 DIAGNOSIS — F840 Autistic disorder: Secondary | ICD-10-CM | POA: Diagnosis not present
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
## 2021-08-17 ENCOUNTER — Encounter: Payer: Self-pay | Admitting: Family Medicine

## 2021-08-17 ENCOUNTER — Ambulatory Visit: Payer: 59 | Admitting: Family Medicine

## 2021-08-17 VITALS — BP 106/70 | HR 90 | Temp 98.2°F | Ht 64.0 in | Wt 243.4 lb

## 2021-08-17 DIAGNOSIS — F840 Autistic disorder: Secondary | ICD-10-CM

## 2021-08-17 DIAGNOSIS — Z62819 Personal history of unspecified abuse in childhood: Secondary | ICD-10-CM

## 2021-08-17 DIAGNOSIS — F321 Major depressive disorder, single episode, moderate: Secondary | ICD-10-CM

## 2021-08-17 MED ORDER — PAROXETINE HCL 10 MG PO TABS: 20.0000 mg | ORAL_TABLET | Freq: Every day | ORAL | 3 refills | Status: DC

## 2021-08-17 NOTE — Patient Instructions (Signed)
Please return in 6 weeks to recheck mood.   Increase your paxil '20mg'$  daily.   If you have any questions or concerns, please don't hesitate to send me a message via MyChart or call the office at [phone]. Thank you for visiting with [place] today! It's our pleasure caring for you.   When you're feeling too down to do anything, try these 10 Little things!  Take a shower. Even if you plan to stay in all day long and not see a soul, take a shower. It takes the most effort to hop in to the shower but once you do, you'll feel immediate results. It will wake you up and you'll be feeling much fresher (and cleaner too).  Brush and floss your teeth. Give your teeth a good brushing with a floss finish. It's a small task but it feels so good and you can check 'taking care of your health' off the list of things to do.  Do something small on your list. Most of [place] have some small thing we would like to get done (load of laundry, sew a button, email a friend). Doing one of these things will make you feel like you've accomplished something.  Drink water. Drinking water is easy right? It's also really beneficial for your health so keep a glass beside you all day and take sips often. It gives you energy and prevents you from boredom eating.  Do some floor exercises. The last thing you want to do is exercise but it might be just the thing you need the most. Keep it simple and do exercises that involve sitting or laying on the floor. Even the smallest of exercises release chemicals in the brain that make you feel good. Yoga stretches or core exercises are going to make you feel good with minimal effort.  Make your bed. Making your bed takes a few minutes but it's productive and you'll feel relieved when it's done. An unmade bed is a huge visual reminder that you're having an unproductive day. Do it and consider it your housework for the day.  Put on some nice clothes. Take the sweatpants off even if you don't  plan to go anywhere. Put on clothes that make you feel good. Take a look in the mirror so your brain recognizes the sweatpants have been replaced with clothes that make you look great. It's an instant confidence booster.  Wash the dishes. A pile of dirty dishes in the sink is a reflection of your mood. It's possible that if you wash up the dishes, your mood will follow suit. It's worth a try.  Cook a real meal. If you have the luxury to have a "do nothing" day, you have time to make a real meal for yourself. Make a meal that you love to eat. The process is good to get you out of the funk and the food will ensure you have more energy for tomorrow.  Write out your thoughts by hand. When you hand write, you stimulate your brain to focus on the moment that you're in so make yourself comfortable and write whatever comes into your mind. Put those thoughts out on paper so they stop spinning around in your head. Those thoughts might be the very thing holding you down.
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
## 2021-09-29 ENCOUNTER — Ambulatory Visit: Payer: 59 | Admitting: Family Medicine

## 2021-09-29 ENCOUNTER — Encounter: Payer: Self-pay | Admitting: Family Medicine

## 2021-09-29 VITALS — BP 102/80 | HR 88 | Temp 98.1°F | Ht 64.0 in | Wt 242.0 lb

## 2021-09-29 DIAGNOSIS — F321 Major depressive disorder, single episode, moderate: Secondary | ICD-10-CM | POA: Diagnosis not present

## 2021-09-29 DIAGNOSIS — F840 Autistic disorder: Secondary | ICD-10-CM

## 2021-09-29 DIAGNOSIS — G4709 Other insomnia: Secondary | ICD-10-CM | POA: Diagnosis not present

## 2021-09-29 DIAGNOSIS — F4310 Post-traumatic stress disorder, unspecified: Secondary | ICD-10-CM | POA: Insufficient documentation

## 2021-09-29 MED ORDER — PAROXETINE HCL 20 MG PO TABS: 20.0000 mg | ORAL_TABLET | Freq: Every day | ORAL | 3 refills | Status: DC

## 2021-09-29 NOTE — Progress Notes (Signed)
Subjective  CC:  Chief Complaint  Patient presents with   Follow-up    Pt is here to f/u on mood, she feels like things are getting better.    HPI: Mandy Whitehead is a 21 y.o. female who presents to the office today to address the problems listed above in the chief complaint, mood problems. 21 year old with autism spectrum disorder and mood disorder here for follow-up.  See last notes.  We increased her Paxil from 10 to 20 mg daily and fortunately, she has felt a positive response from this.  Better motivation, mood is lifted and no adverse effects.  Fortunately, she was able to find a psychiatric therapist Sabas Sous, LCSW out of [place] that specializes in autism disorder and she and her mother are feeling good about her sessions.  Working on Building [employer] strategies for PTSD and depression. Poor sleep patterns: Unable to fall asleep easily at night.  Will often awaken with anxiety.  Falls asleep early morning and sleeps till late afternoon.  Ongoing for months to years.  Slightly worsening now.     02/16/2021   11:28 AM 01/08/2021   10:08 AM 12/18/2018    8:48 AM  Depression screen PHQ 2/9  Decreased Interest 0 0 0  Down, Depressed, Hopeless 1 3 0  PHQ - 2 Score 1 3 0  Altered sleeping 2 3   Tired, decreased energy 2 3   Change in appetite 0 2   Feeling bad or failure about yourself  1 1   Trouble concentrating 0 2   Moving slowly or fidgety/restless 0 3   Suicidal thoughts 0 0   PHQ-9 Score 6 17   Difficult doing work/chores Somewhat difficult Somewhat difficult       02/16/2021   11:29 AM 01/08/2021   10:10 AM  GAD 7 : Generalized Anxiety Score  Nervous, Anxious, on Edge 1 2  Control/stop worrying 2 2  Worry too much - different things 2 3  Trouble relaxing 0 0  Restless 3 3  Easily annoyed or irritable 0 0  Afraid - awful might happen 0 1  Total GAD 7 Score 8 11  Anxiety Difficulty Somewhat difficult Somewhat difficult     Assessment  1. Current  moderate episode of major depressive disorder without prior episode ([place])   2. Autism spectrum disorder   3. PTSD (post-traumatic stress disorder)   4. Secondary insomnia      Plan  Mood d/o: depression/ptsd: Doing better.  Continue Paxil 20 mg nightly.  Discussed poor sleep.  Continue psychotherapy. Insomnia: Secondary.  Work with therapist to discuss improving sleep patterns.  Return to discuss medical therapies if not improving. Autism: Working with special therapist.  Follow up: October for complete physical and mood follow-up No orders of the defined types were placed in this encounter.  Meds ordered this encounter  Medications   PARoxetine (PAXIL) 20 MG tablet    Sig: Take 1 tablet (20 mg total) by mouth daily.    Dispense:  90 tablet    Refill:  3      I reviewed the patients updated PMH, FH, and SocHx.    Patient Active Problem List   Diagnosis Date Noted   PTSD (post-traumatic stress disorder) 09/29/2021   Current moderate episode of major depressive disorder without prior episode ([place]) 01/08/2021   Morbid obesity ([place]) 01/08/2021   Anxiety 01/08/2021   Iron deficiency anemia due to chronic blood loss 07/15/2019   Scoliosis of thoracic spine 07/11/2019  Hirsutism 07/10/2019   Seasonal allergic rhinitis due to pollen 07/10/2019   Autism spectrum disorder 12/18/2018   Obsessive-compulsive disorder 12/18/2018   Acne vulgaris 10/03/2016   BMI (body mass index), pediatric, 95-99% for age 55/11/2016   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   Evening Primrose Oil 1000 MG CAPS Take 1 capsule by mouth daily.   Ferrous Sulfate (IRON) 325 (65 Fe) MG TABS Take by mouth.   fluticasone (FLONASE) 50 MCG/ACT nasal spray USE 1 SPRAY IN EACH NOSTRIL DAILY   hydrOXYzine (ATARAX/VISTARIL) 10 MG tablet Take 1-2 tablets (10-20 mg total) by mouth at bedtime as needed.    Magnesium 100 MG CAPS Take by mouth.   Multiple Vitamin (MULTIVITAMIN) capsule Take 1 capsule by mouth daily.   Norgestimate-Ethinyl Estradiol Triphasic 0.18/0.215/0.25 MG-35 MCG tablet Take 1 tablet by mouth daily. From dermatology for Acne   UNABLE TO FIND daily. Med Name: [place] Support   vitamin C (ASCORBIC ACID) 500 MG tablet Take 500 mg by mouth daily.   [DISCONTINUED] PARoxetine (PAXIL) 10 MG tablet Take 2 tablets (20 mg total) by mouth daily.    Allergies: Patient is allergic to penicillin g. Family history:  Patient family history includes Arthritis in her maternal grandfather and maternal grandmother; Asthma in her paternal grandmother; Cancer in her paternal grandfather; Diabetes in her paternal grandmother; Heart disease in her maternal grandmother and paternal grandmother; High Cholesterol in her paternal grandmother; High blood pressure in her father, maternal grandfather, and paternal grandmother; Kidney disease in her paternal grandmother; 90 / [place] in her mother. Social History   Socioeconomic History   Marital status: Single    Spouse name: Not on file   Number of children: Not on file   Years of education: Not on file   Highest education level: Not on file  Occupational History   Not on file  Tobacco Use   Smoking status: Never   Smokeless tobacco: Never  Vaping Use   Vaping Use: Never used  Substance and Sexual Activity   Alcohol use: Never   Drug use: Never   Sexual activity: Never  Other Topics Concern   Not on file  Social History Narrative   Not on file   Social Determinants of Health   Financial Resource Strain: Not on file  Food Insecurity: Not on file  Transportation Needs: Not on file  Physical Activity: Not on file  Stress: Not on file  Social Connections: Not on file     Review of Systems: Constitutional: Negative for fever malaise or anorexia Cardiovascular: negative for chest pain Respiratory:  negative for SOB or persistent cough Gastrointestinal: negative for abdominal pain  Objective  Vitals: BP 102/80   Pulse 88   Temp 98.1 F (36.7 C)   Ht '5\' 4"'$  (1.626 m)   Wt 242 lb (109.8 kg)   SpO2 97%   BMI 41.54 kg/m  General: no acute distress, well appearing, no apparent distress, well groomed Psych:  Alert and oriented x 3, interactive, smiles, slightly flat .    Commons side effects, risks, benefits, and alternatives for medications and treatment plan prescribed today were discussed, and the patient expressed understanding of the given instructions. Patient is instructed to call or message via MyChart if he/she has any questions or concerns regarding our treatment plan. No barriers to understanding were identified. We discussed Red Flag symptoms  and signs in detail. Patient expressed understanding regarding what to do in case of urgent or emergency type symptoms.  Medication list was reconciled, printed and provided to the patient in AVS. Patient instructions and summary information was reviewed with the patient as documented in the AVS. This note was prepared with assistance of Dragon voice recognition software. Occasional wrong-word or sound-a-like substitutions may have occurred due to the inherent limitations of voice recognition software

## 2021-09-29 NOTE — Patient Instructions (Signed)
Please return in October for your annual complete physical; please come fasting.   If you have any questions or concerns, please don't hesitate to send me a message via MyChart or call the office at [phone]. Thank you for visiting with [place] today! It's our pleasure caring for you.

## 2021-11-18 ENCOUNTER — Encounter: Payer: Self-pay | Admitting: Family Medicine

## 2021-11-19 MED ORDER — PAROXETINE HCL 20 MG PO TABS: 20.0000 mg | ORAL_TABLET | Freq: Every day | ORAL | 3 refills | Status: DC

## 2021-11-19 NOTE — Addendum Note (Signed)
Addended by: Zacarias Pontes on: 11/19/2021 01:59 PM   Modules accepted: Orders

## 2021-12-20 ENCOUNTER — Encounter: Payer: Self-pay | Admitting: *Deleted

## 2022-01-19 ENCOUNTER — Ambulatory Visit (INDEPENDENT_AMBULATORY_CARE_PROVIDER_SITE_OTHER): Payer: 59 | Admitting: Family Medicine

## 2022-01-19 ENCOUNTER — Encounter: Payer: Self-pay | Admitting: Family Medicine

## 2022-01-19 VITALS — BP 100/60 | HR 87 | Temp 98.1°F | Ht 64.0 in | Wt 239.4 lb

## 2022-01-19 DIAGNOSIS — Z23 Encounter for immunization: Secondary | ICD-10-CM | POA: Diagnosis not present

## 2022-01-19 DIAGNOSIS — Z0000 Encounter for general adult medical examination without abnormal findings: Secondary | ICD-10-CM

## 2022-01-19 DIAGNOSIS — F321 Major depressive disorder, single episode, moderate: Secondary | ICD-10-CM

## 2022-01-19 DIAGNOSIS — F840 Autistic disorder: Secondary | ICD-10-CM

## 2022-01-19 DIAGNOSIS — D649 Anemia, unspecified: Secondary | ICD-10-CM | POA: Diagnosis not present

## 2022-01-19 DIAGNOSIS — Z87898 Personal history of other specified conditions: Secondary | ICD-10-CM | POA: Insufficient documentation

## 2022-01-19 DIAGNOSIS — L700 Acne vulgaris: Secondary | ICD-10-CM

## 2022-01-19 LAB — CBC WITH DIFFERENTIAL/PLATELET
Basophils Absolute: 0 10*3/uL (ref 0.0–0.1)
Basophils Relative: 0.6 % (ref 0.0–3.0)
Eosinophils Absolute: 0.1 10*3/uL (ref 0.0–0.7)
Eosinophils Relative: 1.4 % (ref 0.0–5.0)
HCT: 32.9 % — ABNORMAL LOW (ref 36.0–46.0)
Hemoglobin: 10.9 g/dL — ABNORMAL LOW (ref 12.0–15.0)
Lymphocytes Relative: 27.1 % (ref 12.0–46.0)
Lymphs Abs: 1.8 10*3/uL (ref 0.7–4.0)
MCHC: 33 g/dL (ref 30.0–36.0)
MCV: 80.1 fl (ref 78.0–100.0)
Monocytes Absolute: 0.3 10*3/uL (ref 0.1–1.0)
Monocytes Relative: 4.5 % (ref 3.0–12.0)
Neutro Abs: 4.3 10*3/uL (ref 1.4–7.7)
Neutrophils Relative %: 66.4 % (ref 43.0–77.0)
Platelets: 302 10*3/uL (ref 150.0–400.0)
RBC: 4.11 Mil/uL (ref 3.87–5.11)
RDW: 14.8 % (ref 11.5–15.5)
WBC: 6.5 10*3/uL (ref 4.0–10.5)

## 2022-01-19 LAB — B12 AND FOLATE PANEL
Folate: 17.9 ng/mL (ref >5.9)
Vitamin B-12: 309 pg/mL (ref 211–911)

## 2022-01-19 LAB — HEMOGLOBIN A1C: Hgb A1c MFr Bld: 6.2 % (ref 4.6–6.5)

## 2022-01-19 LAB — TSH: TSH: 1.19 u[IU]/mL (ref 0.35–5.50)
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
## 2022-01-19 NOTE — Patient Instructions (Signed)
Please return in 12 months for your annual complete physical; please come fasting.   I will release your lab results to you on your MyChart account with further instructions. You may see the results before I do, but when I review them I will send you a message with my report or have my assistant call you if things need to be discussed. Please reply to my message with any questions. Thank you!   If you have any questions or concerns, please don't hesitate to send me a message via MyChart or call the office at [phone]. Thank you for visiting with [place] today! It's our pleasure caring for you.   Keep exercising! This is great for you!

## 2022-01-20 ENCOUNTER — Encounter: Payer: Self-pay | Admitting: Family Medicine

## 2022-01-20 DIAGNOSIS — R7303 Prediabetes: Secondary | ICD-10-CM | POA: Insufficient documentation

## 2022-01-20 LAB — IRON,TIBC AND FERRITIN PANEL
%SAT: 25 % (calc) (ref 16–45)
Ferritin: 11 ng/mL — ABNORMAL LOW (ref 16–154)
Iron: 103 ug/dL (ref 40–190)
TIBC: 405 mcg/dL (calc) (ref 250–450)

## 2022-05-16 ENCOUNTER — Other Ambulatory Visit: Payer: Self-pay | Admitting: Family Medicine

## 2022-07-12 ENCOUNTER — Emergency Department (HOSPITAL_BASED_OUTPATIENT_CLINIC_OR_DEPARTMENT_OTHER)
Admission: EM | Admit: 2022-07-12 | Discharge: 2022-07-12 | Disposition: A | Discharge status: Home / Self Care | Payer: Medicaid Other | Attending: Emergency Medicine | Admitting: Emergency Medicine

## 2022-07-12 ENCOUNTER — Encounter (HOSPITAL_BASED_OUTPATIENT_CLINIC_OR_DEPARTMENT_OTHER): Payer: Self-pay

## 2022-07-12 ENCOUNTER — Other Ambulatory Visit: Payer: Self-pay

## 2022-07-12 DIAGNOSIS — U071 COVID-19: Secondary | ICD-10-CM | POA: Insufficient documentation

## 2022-07-12 DIAGNOSIS — R509 Fever, unspecified: Secondary | ICD-10-CM | POA: Diagnosis present

## 2022-07-12 LAB — RESP PANEL BY RT-PCR (RSV, FLU A&B, COVID)  RVPGX2
Influenza A by PCR: NEGATIVE
Influenza B by PCR: NEGATIVE
Resp Syncytial Virus by PCR: NEGATIVE
SARS Coronavirus 2 by RT PCR: POSITIVE — AB

## 2022-07-12 MED ORDER — IBUPROFEN 800 MG PO TABS
800.0000 mg | ORAL_TABLET | Freq: Once | ORAL | Status: AC
  Administered 2022-07-12: 800 mg via ORAL
  Filled 2022-07-12: qty 1

## 2022-07-12 NOTE — Discharge Instructions (Signed)
You were seen in the emergency department for fever. You tested positive for COVID-19. Continue to manage symptoms with Tylenol/Ibuprofen as needed for fevers. Manage any cough or congestion with Mucinex. If you experience shortness of breath, chest pain, or severe vomiting or diarrhea leading to dehydration, please return to the emergency department for further evaluation.

## 2022-07-12 NOTE — ED Provider Notes (Signed)
 [facility] Provider Note   CSN: [id number] Arrival date & time: 07/12/22  2053     History Chief Complaint  Patient presents with   Fever    Mandy Whitehead is a 22 y.o. female.  Patient presents emergency department complaints of fever.  Patient's parents report that she has been experiencing a fever for the last few days that is responding well to Tylenol and ibuprofen that they are alternating intermittently.  Patient had a positive COVID-19 test at home.  Denies any shortness of breath, chest pain, abdominal pain, nausea, vomiting.   Fever      Home Medications Prior to Admission medications   Medication Sig Start Date End Date Taking? Authorizing Provider  Cholecalciferol (VITAMIN D3) 125 MCG (5000 UT) CAPS Take by mouth.    [provider]  Evening Primrose Oil 1000 MG CAPS Take 1 capsule by mouth daily.    [provider]  Ferrous Sulfate (IRON) 325 (65 Fe) MG TABS Take by mouth.    [provider]  fluticasone (FLONASE) 50 MCG/ACT nasal spray USE 1 SPRAY IN EACH NOSTRIL DAILY 05/16/22   Willow Ora, MD  hydrOXYzine (ATARAX/VISTARIL) 10 MG tablet Take 1-2 tablets (10-20 mg total) by mouth at bedtime as needed. 07/10/19   Willow Ora, MD  Magnesium 100 MG CAPS Take by mouth.    [provider]  Multiple Vitamin (MULTIVITAMIN) capsule Take 1 capsule by mouth daily.    [provider]  Norgestimate-Ethinyl Estradiol Triphasic 0.18/0.215/0.25 MG-35 MCG tablet Take 1 tablet by mouth daily. From dermatology for Acne    [provider]  PARoxetine (PAXIL) 20 MG tablet Take 1 tablet (20 mg total) by mouth daily. 11/19/21   Willow Ora, MD  UNABLE TO FIND daily. Med Name: [facility] Nervous System Support    [provider]  vitamin C (ASCORBIC ACID) 500 MG tablet Take 500 mg by mouth daily.    [provider]      Allergies    Penicillin g    Review of  Systems   Review of Systems  Constitutional:  Positive for fever.  All other systems reviewed and are negative.   Physical Exam Updated Vital Signs BP 108/70 (BP Location: Right Arm)   Pulse (!) 120   Temp (!) 102.8 F (39.3 C) (Oral)   Resp 19   Ht 5\' 3"  (1.6 m)   Wt 104.3 kg   BMI 40.74 kg/m  Physical Exam Vitals and nursing note reviewed.  Constitutional:      General: She is not in acute distress.    Appearance: She is well-developed.  HENT:     Head: Normocephalic and atraumatic.  Eyes:     Conjunctiva/sclera: Conjunctivae normal.  Cardiovascular:     Rate and Rhythm: Normal rate and regular rhythm.     Heart sounds: No murmur heard. Pulmonary:     Effort: Pulmonary effort is normal. No respiratory distress.     Breath sounds: Normal breath sounds.  Abdominal:     Palpations: Abdomen is soft.     Tenderness: There is no abdominal tenderness.  Musculoskeletal:        General: No swelling.     Cervical back: Neck supple.  Skin:    General: Skin is warm and dry.     Capillary Refill: Capillary refill takes less than 2 seconds.  Neurological:     Mental Status: She is alert.  Psychiatric:  Mood and Affect: Mood normal.     ED Results / Procedures / Treatments   Labs (all labs ordered are listed, but only abnormal results are displayed) Labs Reviewed  RESP PANEL BY RT-PCR (RSV, FLU A&B, COVID)  [id number] - Abnormal; Notable for the following components:      Result Value   SARS Coronavirus 2 by RT PCR POSITIVE (*)    All other components within normal limits    EKG None  Radiology No results found.  Procedures Procedures   Medications Ordered in ED Medications  ibuprofen (ADVIL) tablet 800 mg (800 mg Oral Given 07/12/22 2115)    ED Course/ Medical Decision Making/ A&P                           Medical Decision Making Risk Prescription drug management.   This patient presents to the ED for concern of fever.  Differential diagnosis  includes COVID-19, influenza, RSV, bronchitis, pneumonia   Lab Tests:  I Ordered, and personally interpreted labs.  The pertinent results include: Positive COVID-19   Medicines ordered and prescription drug management:  I ordered medication including ibuprofen for fever Reevaluation of the patient after these medicines showed that the patient improved I have reviewed the patients home medicines and have made adjustments as needed   Problem List / ED Course:  Patient presents to the emergency department with parents concern for fever.  They report that they had a positive COVID test at home.  Patient has been experiencing body aches as well as right ear pain with this fever.  Denies any other symptoms such as chest pain, shortness of breath, abdominal pain, nausea, vomiting, diarrhea.  Given presentation, respiratory viral panel was performed to confirm positive COVID home test and patient is indeed positive for COVID-19.  Informed patient of these findings and advised patient and parents to manage symptoms as best they can with at home treatment including Tylenol, ibuprofen, Mucinex.  Educated patient and parents on ibuprofen and Tylenol dosing to ensure that patient is having improvement in relief of fever.  At this time believe that patient is hemodynamically stable for discharge he does not need further evaluation here in the emergency department.  Patient and parents agreeable to treatment plan verbalized understanding all return precautions.  All questions answered prior to patient discharge.  Final Clinical Impression(s) / ED Diagnoses Final diagnoses:  COVID-19    Rx / DC Orders ED Discharge Orders     None         Smitty Knudsen, PA-C 07/12/22 2341    Sloan Leiter, DO 07/14/22 1541

## 2022-07-12 NOTE — ED Triage Notes (Addendum)
Pt with positive covid test at home. Fevers at home of 102. Tylenol helping with fever intermittently. Pt endorses body aches and RT ear pain. Took tylenol 1 hr ago; ibuprofen 6 hrs ago.

## 2022-08-03 ENCOUNTER — Ambulatory Visit: Payer: 59 | Admitting: Family Medicine

## 2022-11-14 ENCOUNTER — Other Ambulatory Visit: Payer: Self-pay | Admitting: Family Medicine

## 2023-01-18 ENCOUNTER — Encounter: Payer: Self-pay | Admitting: Family Medicine

## 2023-01-18 ENCOUNTER — Ambulatory Visit: Payer: 59 | Admitting: Family Medicine

## 2023-01-18 VITALS — BP 124/70 | HR 103 | Temp 98.2°F | Ht 63.0 in | Wt 257.0 lb

## 2023-01-18 DIAGNOSIS — L538 Other specified erythematous conditions: Secondary | ICD-10-CM

## 2023-01-18 MED ORDER — PREDNISONE 10 MG PO TABS: ORAL_TABLET | ORAL | 0 refills | Status: DC

## 2023-01-18 NOTE — Patient Instructions (Signed)
Please follow up as scheduled for your next visit with me: 02/01/2023   If you have any questions or concerns, please don't hesitate to send me a message via MyChart or call the office at [phone]. Thank you for visiting with [place] today! It's our pleasure caring for you.   Please start zyrtec 10mg  nightly and complete the prednisone taper as directed over the next 7 days.  Let me know if things do not improve.  I will notify you if the strep culture returns positive. This will take a day or two to result.   Rash, Adult A rash is a breakout of spots or blotches on the skin. It can affect the way the skin looks and feels. Many things can cause a rash. Common causes include: Viral infections. These include colds, measles, and hand, foot, and mouth disease. Bacterial infections. These include scarlet fever and impetigo. Fungal infections. These include athlete's foot, ringworm, and yeast rashes. Skin irritation. This may be from heat rash, exposure to moisture or friction for a long time (intertrigo), or exposure to soap or skin care products (eczema). Allergic reactions. These may be caused by foods, medicines, or things like poison ivy. Some rashes may go away after a few days. Others may last for a few weeks. The goal of treatment is to stop the itching and keep the rash from spreading. Follow these instructions at home: Medicine Take or apply over-the-counter and prescription medicines only as told by your health care provider. These may include: Corticosteroids. These can help treat red or swollen skin. They may be given as creams or as medicines to take by mouth (orally). Anti-itch lotions. Allergy medicines. Pain medicine. Antifungal medicine if the rash is from a fungal infection. Antibiotics if you have an infection.  Skin care Apply cool, wet cloths (compresses) to the affected areas. Do not scratch or rub your skin. Avoid covering the rash. Keep it exposed to air as often as you  can. Managing itching and discomfort Avoid hot showers and baths. These can make itching worse. A cold shower may help. Try taking a bath with: Epsom salts. You can get these at your local pharmacy or grocery store. Follow the instructions on the package. Baking soda. Pour a small amount into the bath as told by your provider. Colloidal oatmeal. You can get this at your local pharmacy or grocery store. Follow the instructions on the package. Try putting baking soda paste on your skin. Stir water into baking soda until it becomes like a paste. Try using calamine lotion or cortisone cream to help with itchiness. Keep cool. Stay out of the sun. Sweating and being hot can make itching worse. General instructions  Rest as needed. Drink enough fluid to keep your pee (urine) pale yellow. Wear loose-fitting clothes. Avoid scented soaps, detergents, and perfumes. Use gentle soaps, detergents, perfumes, and cosmetics. Avoid the things that cause your rash (triggers). Keep a journal to help keep track of your triggers. Write down: What you eat. What cosmetics you use. What you drink. What you wear. This includes jewelry. Contact a health care provider if: You sweat at night more than normal. You pee (urinate) more or less than normal, or your pee is a darker color than normal. Your eyes become sensitive to light. Your skin or the white parts of your eyes turn yellow (jaundice). Your skin tingles or is numb. You get painful blisters in your nose or mouth. Your rash does not go away after a few days,  or it gets worse. You are more tired or thirsty than normal. You have new or worse symptoms. These may include: Pain in your abdomen. Fever. Diarrhea or vomiting. Weakness or weight loss. Get help right away if: You get confused. You have a severe headache, a stiff neck, or severe joint pain or stiffness. You become very sleepy or not responsive. You have a seizure. This information is not  intended to replace advice given to you by your health care provider. Make sure you discuss any questions you have with your health care provider. Document Revised: 12/31/2021 Document Reviewed: 12/31/2021 Elsevier Patient Education  2024 [employer].
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
## 2023-01-20 LAB — CULTURE, GROUP A STREP
Micro Number: 15633711
SPECIMEN QUALITY:: ADEQUATE

## 2023-01-24 NOTE — Progress Notes (Signed)
See mychart note Dear [name], I hope your rash has improved. Your strep test is negative. Sincerely, Dr. Mardelle Matte

## 2023-01-31 ENCOUNTER — Encounter: Payer: 59 | Admitting: Family Medicine

## 2023-02-01 ENCOUNTER — Encounter: Payer: 59 | Admitting: Family Medicine

## 2023-03-28 DIAGNOSIS — Z0289 Encounter for other administrative examinations: Secondary | ICD-10-CM

## 2023-04-03 ENCOUNTER — Encounter: Payer: Self-pay | Admitting: Family Medicine

## 2023-04-03 ENCOUNTER — Ambulatory Visit (INDEPENDENT_AMBULATORY_CARE_PROVIDER_SITE_OTHER): Payer: 59 | Admitting: Family Medicine

## 2023-04-03 VITALS — BP 100/80 | HR 85 | Temp 98.1°F | Ht 63.0 in | Wt 260.4 lb

## 2023-04-03 DIAGNOSIS — R7303 Prediabetes: Secondary | ICD-10-CM

## 2023-04-03 DIAGNOSIS — F429 Obsessive-compulsive disorder, unspecified: Secondary | ICD-10-CM

## 2023-04-03 DIAGNOSIS — Z560 Unemployment, unspecified: Secondary | ICD-10-CM | POA: Insufficient documentation

## 2023-04-03 DIAGNOSIS — F4310 Post-traumatic stress disorder, unspecified: Secondary | ICD-10-CM

## 2023-04-03 DIAGNOSIS — F321 Major depressive disorder, single episode, moderate: Secondary | ICD-10-CM | POA: Diagnosis not present

## 2023-04-03 DIAGNOSIS — D500 Iron deficiency anemia secondary to blood loss (chronic): Secondary | ICD-10-CM

## 2023-04-03 DIAGNOSIS — G4709 Other insomnia: Secondary | ICD-10-CM | POA: Insufficient documentation

## 2023-04-03 DIAGNOSIS — Z0001 Encounter for general adult medical examination with abnormal findings: Secondary | ICD-10-CM | POA: Diagnosis not present

## 2023-04-03 DIAGNOSIS — Z6841 Body Mass Index (BMI) 40.0 and over, adult: Secondary | ICD-10-CM | POA: Diagnosis not present

## 2023-04-03 DIAGNOSIS — F840 Autistic disorder: Secondary | ICD-10-CM

## 2023-04-03 MED ORDER — TRAZODONE HCL 50 MG PO TABS
25.0000 mg | ORAL_TABLET | Freq: Every evening | ORAL | 3 refills | Status: DC | PRN
Start: 2023-04-03 — End: 2023-06-08
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
## 2023-04-06 ENCOUNTER — Other Ambulatory Visit (INDEPENDENT_AMBULATORY_CARE_PROVIDER_SITE_OTHER): Payer: 59

## 2023-04-06 DIAGNOSIS — D500 Iron deficiency anemia secondary to blood loss (chronic): Secondary | ICD-10-CM

## 2023-04-06 DIAGNOSIS — R7303 Prediabetes: Secondary | ICD-10-CM | POA: Diagnosis not present

## 2023-04-06 DIAGNOSIS — Z0001 Encounter for general adult medical examination with abnormal findings: Secondary | ICD-10-CM | POA: Diagnosis not present

## 2023-04-06 DIAGNOSIS — F321 Major depressive disorder, single episode, moderate: Secondary | ICD-10-CM | POA: Diagnosis not present

## 2023-04-06 LAB — CBC WITH DIFFERENTIAL/PLATELET
Basophils Absolute: 0 10*3/uL (ref 0.0–0.1)
Basophils Relative: 0.6 % (ref 0.0–3.0)
Eosinophils Absolute: 0.1 10*3/uL (ref 0.0–0.7)
Eosinophils Relative: 1.3 % (ref 0.0–5.0)
HCT: 36.1 % (ref 36.0–46.0)
Hemoglobin: 11.7 g/dL — ABNORMAL LOW (ref 12.0–15.0)
Lymphocytes Relative: 30.1 % (ref 12.0–46.0)
Lymphs Abs: 2.5 10*3/uL (ref 0.7–4.0)
MCHC: 32.4 g/dL (ref 30.0–36.0)
MCV: 81.9 fL (ref 78.0–100.0)
Monocytes Absolute: 0.4 10*3/uL (ref 0.1–1.0)
Monocytes Relative: 5 % (ref 3.0–12.0)
Neutro Abs: 5.2 10*3/uL (ref 1.4–7.7)
Neutrophils Relative %: 63 % (ref 43.0–77.0)
Platelets: 297 10*3/uL (ref 150.0–400.0)
RBC: 4.4 Mil/uL (ref 3.87–5.11)
RDW: 14.7 % (ref 11.5–15.5)
WBC: 8.2 10*3/uL (ref 4.0–10.5)

## 2023-04-06 LAB — COMPREHENSIVE METABOLIC PANEL
ALT: 14 U/L (ref 0–35)
AST: 17 U/L (ref 0–37)
Albumin: 3.7 g/dL (ref 3.5–5.2)
Alkaline Phosphatase: 79 U/L (ref 39–117)
BUN: 7 mg/dL (ref 6–23)
CO2: 24 meq/L (ref 19–32)
Calcium: 9 mg/dL (ref 8.4–10.5)
Chloride: 104 meq/L (ref 96–112)
Creatinine, Ser: 0.72 mg/dL (ref 0.40–1.20)
GFR: 117.07 mL/min (ref >60.00)
Glucose, Bld: 99 mg/dL (ref 70–99)
Potassium: 3.6 meq/L (ref 3.5–5.1)
Sodium: 135 meq/L (ref 135–145)
Total Bilirubin: 0.4 mg/dL (ref 0.2–1.2)
Total Protein: 7.1 g/dL (ref 6.0–8.3)

## 2023-04-06 LAB — HEMOGLOBIN A1C: Hgb A1c MFr Bld: 6.4 % (ref 4.6–6.5)

## 2023-04-06 LAB — LIPID PANEL
Cholesterol: 199 mg/dL (ref 0–200)
HDL: 52.1 mg/dL (ref >39.00)
LDL Cholesterol: 130 mg/dL — ABNORMAL HIGH (ref 0–99)
NonHDL: 147.03
Total CHOL/HDL Ratio: 4
Triglycerides: 85 mg/dL (ref 0.0–149.0)
VLDL: 17 mg/dL (ref 0.0–40.0)

## 2023-04-06 LAB — TSH: TSH: 2.33 u[IU]/mL (ref 0.35–5.50)

## 2023-04-07 LAB — IRON,TIBC AND FERRITIN PANEL
%SAT: 25 % (ref 16–45)
Ferritin: 24 ng/mL (ref 16–154)
Iron: 92 ug/dL (ref 40–190)
TIBC: 367 ug/dL (ref 250–450)

## 2023-04-14 NOTE — Progress Notes (Signed)
See mychart note Dear [name], Your lab results show high blood sugars. I am glad you will be seeing the weight specialists who will be able to work with you on improving your diet. We will need to recheck these numbers again in 3 months; this can be done with me or them. They often will follow your blood work. Your cholesterol is higher as well, and diet can help improve this also.  Your iron levels look much better. I recommend restarting your birth control to keep your menstrual cycles light. Continue taking iron once a day for the next 3 months to fully restore your iron levels.  Your mother reported that you have an appointment with a psychiatrist soon; I'm hopeful finding the right medications will help you feel better.   Sincerely, Dr. Mardelle Matte

## 2023-04-27 ENCOUNTER — Encounter (INDEPENDENT_AMBULATORY_CARE_PROVIDER_SITE_OTHER): Payer: Self-pay | Admitting: Internal Medicine

## 2023-04-27 ENCOUNTER — Ambulatory Visit (INDEPENDENT_AMBULATORY_CARE_PROVIDER_SITE_OTHER): Payer: 59 | Admitting: Internal Medicine

## 2023-04-27 VITALS — BP 117/84 | HR 89 | Temp 98.0°F | Ht 63.0 in | Wt 254.0 lb

## 2023-04-27 DIAGNOSIS — R7309 Other abnormal glucose: Secondary | ICD-10-CM | POA: Diagnosis not present

## 2023-04-27 DIAGNOSIS — Z6841 Body Mass Index (BMI) 40.0 and over, adult: Secondary | ICD-10-CM

## 2023-04-27 DIAGNOSIS — T50905A Adverse effect of unspecified drugs, medicaments and biological substances, initial encounter: Secondary | ICD-10-CM | POA: Insufficient documentation

## 2023-04-27 DIAGNOSIS — R29818 Other symptoms and signs involving the nervous system: Secondary | ICD-10-CM | POA: Diagnosis not present

## 2023-04-27 DIAGNOSIS — R5383 Other fatigue: Secondary | ICD-10-CM

## 2023-04-27 DIAGNOSIS — E7800 Pure hypercholesterolemia, unspecified: Secondary | ICD-10-CM

## 2023-04-27 DIAGNOSIS — R0602 Shortness of breath: Secondary | ICD-10-CM

## 2023-04-27 DIAGNOSIS — Z1331 Encounter for screening for depression: Secondary | ICD-10-CM | POA: Diagnosis not present

## 2023-04-27 DIAGNOSIS — R7303 Prediabetes: Secondary | ICD-10-CM

## 2023-04-27 DIAGNOSIS — E66813 Obesity, class 3: Secondary | ICD-10-CM | POA: Insufficient documentation

## 2023-04-27 DIAGNOSIS — T43225A Adverse effect of selective serotonin reuptake inhibitors, initial encounter: Secondary | ICD-10-CM

## 2023-04-27 NOTE — Assessment & Plan Note (Signed)
Repeat hemoglobin A1c to make sure that she does not meet criteria for diabetes.

## 2023-04-27 NOTE — Assessment & Plan Note (Signed)
Patient has symptoms of sleep disordered breathing and high risk phenotype. Counseled on the risk of undiagnosed and untreated sleep apnea.  I highly recommend polysomnography.  Will be referred for sleep study at the next visit after further assessment.  Losing 15% of body weight may improve symptoms.  Patient also has an abnormal sleep pattern she states up throughout the night and sleeps during the day.  She has been drinking caffeine in the evening.  She was given information on sleep hygiene.

## 2023-04-27 NOTE — Assessment & Plan Note (Signed)
Patient started to gain weight in her teens which correlates with the starting of paroxetine which is known to be obesogenic.  She tells me that her primary care doctor has referred her to mental health for treatment.  I recommend that she switch to a weight neutral SSRI like fluoxetine or escitalopram.  Also if there are no contraindications for Wellbutrin this may help her with weight loss and inattention.

## 2023-04-27 NOTE — Assessment & Plan Note (Signed)
Most recent LDL was 130.  This may be secondary to genetics, nutritional or spillover effect from excess adiposity.  She was counseled on maintaining a diet low in saturated fats at least less than 10% of calories.  Losing 10% of body weight may improve condition

## 2023-04-27 NOTE — Progress Notes (Signed)
Office: [phone]  /  Fax: [phone]   Subjective   Initial Visit  Mandy Whitehead (MR# [id number]) is an 23 y.o. female who presents for evaluation and treatment of obesity and related comorbidities. Current BMI is Body mass index is 44.99 kg/m. Mandy Whitehead has been struggling with her weight for many years and has been unsuccessful in either losing weight, maintaining weight loss, or reaching her healthy weight goal.  Mandy Whitehead is currently in the action stage of change and ready to dedicate time achieving and maintaining a healthier weight. Mandy Whitehead is interested in becoming our patient and working on intensive lifestyle modifications including (but not limited to) diet and exercise for weight loss.  When asked how their weight has affected their life and health, she states: Has affected self-esteem, Contributed to medical problems, Contributed to orthopedic problems or mobility issues, Having fatigue, and Having poor endurance  When asked what else they would like to accomplish? She states: Adopt healthier eating patterns, Improve energy levels and physical activity, Improve existing medical conditions, and Improve quality of life  Weight history:  She starting to note weight gain during : teens.  Life events associated with weight gain include : starting a medication paroxetine .   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  Their highest weight has been:  260 lbs.  Desired weight: 160  Previous weight-loss programs : None.  Their maximum weight loss was:  na lbs.  Their greatest challenge with dieting: never tried dieting before.  Current or previous pharmacotherapy: None.  Response to medication: Never tried medications  Nutritional  History:  Current nutrition plan: None.  How many times do you eat outside the home: 1-2 per week  How often do they skip meals: skips breakfast  What beverages do they drink: caffeinated beverages > 7 per week, juice > 7 per week, smoothies rarely, and almond milk .   Use of artificial sweetners : Yes  Food intolerances or dislikes: vegetables.  Food triggers: Seeking reward and To help comfort self.  Food cravings: Starches / Carbohydrates and pastas, rice and beans  Do they struggle with excessive hunger or portion control : Yes   Current level of physical activity: None  Past medical history includes:  History reviewed. No pertinent past medical history.   Objective   BP 117/84   Pulse 89   Temp 98 F (36.7 C)   Ht 5\' 3"  (1.6 m)   Wt 254 lb (115.2 kg)   LMP 03/30/2023   SpO2 100%   BMI 44.99 kg/m  She was weighed on the bioimpedance scale: Body mass index is 44.99 kg/m.    Anthropometrics:  Vitals Temp: 98 F (36.7 C) BP: 117/84 Pulse Rate: 89 SpO2: 100 %   Anthropometric Measurements Height: 5\' 3"  (1.6 m) Weight: 254 lb (115.2 kg) BMI (Calculated): 45.01 Starting Weight: 254 lb Peak Weight: 260 lb Waist Measurement : 46 inches   Body Composition  Body Fat %: 49.4 % Fat Mass (lbs): 125.08 lbs Muscle Mass (lbs): 122.4 lbs Total Body Water (lbs): 88.4 lbs Visceral Fat Rating : 13   Other Clinical Data RMR: 1901 Fasting: Yes Labs: Yes Today's Visit #: 1 Starting Date: 04/27/23    Physical Exam:  General: She is overweight, cooperative, alert, well developed, and in no acute distress. PSYCH: Has normal mood, affect  and thought process.   HEENT: EOMI, sclerae are anicteric. Lungs: Normal breathing effort, no conversational dyspnea. Extremities: No edema.  Neurologic: No gross sensory or motor deficits. No tremors or fasciculations noted.    Diagnostic Data Reviewed  EKG: Normal sinus rhythm, rate 81. No conduction abnormalities,  abnormal Q waves or chamber enlargement.  Indirect Calorimeter completed today shows a VO2 of 276 and a REE of 1901.  Her calculated basal metabolic rate is 0865 thus her resting energy expenditure same as calculated.  Depression Screen  Mandy Whitehead's PHQ-9 score was: 13.     04/03/2023   10:19 AM  Depression screen PHQ 2/9  Decreased Interest 2  Down, Depressed, Hopeless 2  PHQ - 2 Score 4  Altered sleeping 3  Tired, decreased energy 2  Change in appetite 1  Feeling bad or failure about yourself  1  Trouble concentrating 1  Moving slowly or fidgety/restless 0  Suicidal thoughts 2  PHQ-9 Score 14  Difficult doing work/chores Somewhat difficult    Screening for Sleep Related Breathing Disorders  Mandy Whitehead admits to daytime somnolence and admits to waking up still tired. Patient denies a history of symptoms of OSA symptoms.  Mandy Whitehead generally gets 6 hours of sleep per night, and states that she has poor sleep quality. Snoring is present. Apneic episodes are not present. Epworth Sleepiness Score is 7.   BMET    Component Value Date/Time   NA 135 04/06/2023 1042   K 3.6 04/06/2023 1042   CL 104 04/06/2023 1042   CO2 24 04/06/2023 1042   GLUCOSE 99 04/06/2023 1042   BUN 7 04/06/2023 1042   CREATININE 0.72 04/06/2023 1042   CALCIUM 9.0 04/06/2023 1042   Lab Results  Component Value Date   HGBA1C 6.4 04/06/2023   HGBA1C 5.9 01/08/2021   No results found for: "INSULIN" CBC    Component Value Date/Time   WBC 8.2 04/06/2023 1042   RBC 4.40 04/06/2023 1042   HGB 11.7 (L) 04/06/2023 1042   HCT 36.1 04/06/2023 1042   PLT 297.0 04/06/2023 1042   MCV 81.9 04/06/2023 1042   MCHC 32.4 04/06/2023 1042   RDW 14.7 04/06/2023 1042   Iron/TIBC/Ferritin/ %Sat    Component Value Date/Time   IRON 92 04/06/2023 1042   TIBC 367 04/06/2023 1042   FERRITIN 24 04/06/2023 1042   IRONPCTSAT 25 04/06/2023 1042   Lipid Panel     Component Value Date/Time   CHOL 199 04/06/2023 1042   TRIG 85.0  04/06/2023 1042   HDL 52.10 04/06/2023 1042   CHOLHDL 4 04/06/2023 1042   VLDL 17.0 04/06/2023 1042   LDLCALC 130 (H) 04/06/2023 1042   Hepatic Function Panel     Component Value Date/Time   PROT 7.1 04/06/2023 1042   ALBUMIN 3.7 04/06/2023 1042   AST 17 04/06/2023 1042   ALT 14 04/06/2023 1042   ALKPHOS 79 04/06/2023 1042   BILITOT 0.4 04/06/2023 1042      Component Value Date/Time   TSH 2.33 04/06/2023 1042     Assessment and Plan   TREATMENT PLAN FOR OBESITY:  Recommended Dietary Goals  Mandy Whitehead is currently in the action stage of change. As such, her goal is to implement medically supervised weight loss plan.  She has agreed to implement: the Category 3 plan - 1500 kcal per day  Behavioral Intervention  We discussed the following Behavioral Modification Strategies today: increasing lean protein intake to established goals, decreasing simple carbohydrates , increasing vegetables, increasing lower glycemic fruits,  increasing fiber rich foods, avoiding skipping meals, increasing water intake, work on meal planning and preparation, reading food labels , keeping healthy foods at home, identifying sources and decreasing liquid calories, decreasing eating out or consumption of processed foods, and making healthy choices when eating convenient foods, planning for success, and better snacking choices  Additional resources provided today: Handout on healthy eating and balanced plate, Handout on complex carbohydrates and lean sources of protein, Handout on symptoms of sleep apnea, health risk and its effect on weight management, Handout on tips for a better sleep, and Category 3 packet  Recommended Physical Activity Goals  Mandy Whitehead has been advised to work up to 150 minutes of moderate intensity aerobic activity a week and strengthening exercises 2-3 times per week for cardiovascular health, weight loss maintenance and preservation of muscle mass.   She has agreed to :  Think about  enjoyable ways to increase daily physical activity and overcoming barriers to exercise and Increase physical activity in their day and reduce sedentary time (increase NEAT).  Pharmacotherapy We will work on building a Therapist, art and behavioral strategies. We will discuss the role of pharmacotherapy as an adjunct at subsequent visits.   ASSOCIATED CONDITIONS ADDRESSED TODAY  Other Fatigue Mandy Whitehead admits to daytime somnolence and admits to waking up still tired. Patient denies a history of symptoms of OSA symptoms.  Mandy Whitehead generally gets 6 hours of sleep per night, and states that she has poor sleep quality. Snoring is present. Apneic episodes are not present. Epworth Sleepiness Score is 7. Mandy Whitehead does feel that her weight is causing her energy to be lower than it should be. Fatigue may be related to obesity, depression or many other causes. Labs will be ordered, and in the meanwhile, Mandy Whitehead will focus on self care including making healthy food choices, increasing physical activity and focusing on stress reduction.  Shortness of Breath Mandy Whitehead notes increasing shortness of breath with exercising and seems to be worsening over time with weight gain. She notes getting out of breath sooner with activity than she used to. This has not gotten worse recently. Mandy Whitehead denies shortness of breath at rest or orthopnea.Mandy Whitehead notes increasing shortness of breath with exercising and seems to be worsening over time with weight gain. She notes getting out of breath sooner with activity than she used to. This has not gotten worse recently. Mandy Whitehead denies shortness of breath at rest or orthopnea.  Other fatigue -     EKG 12-Lead -     Vitamin B12 -     VITAMIN D 25 Hydroxy (Vit-D Deficiency, Fractures)  SOB (shortness of breath) on exertion  Depression screen  Pure hypercholesterolemia Assessment & Plan: Most recent LDL was 130.  This may be secondary to genetics, nutritional or spillover effect from  excess adiposity.  She was counseled on maintaining a diet low in saturated fats at least less than 10% of calories.  Losing 10% of body weight may improve condition   Prediabetes Assessment & Plan: Her most recent A1c was 6.4, and this is trending upward.  She is high risk for progression.  Her mother was recently diagnosed with diabetes as well.  They will be working on environment, increasing physical activity levels, reducing simple and added sugars in diet.  We are checking hemoglobin A1c today as well as insulin levels.  Her fasting blood glucose was 99 recently.  Patient and mother were educated on the carb insulin model of obesity and the importance of maintaining a diet  with a low glycemic load.  We also reviewed balanced plate and differences between simple and complex carbs.   Suspected sleep apnea Assessment & Plan: Patient has symptoms of sleep disordered breathing and high risk phenotype. Counseled on the risk of undiagnosed and untreated sleep apnea.  I highly recommend polysomnography.  Will be referred for sleep study at the next visit after further assessment.  Losing 15% of body weight may improve symptoms.  Patient also has an abnormal sleep pattern she states up throughout the night and sleeps during the day.  She has been drinking caffeine in the evening.  She was given information on sleep hygiene.   Weight gain due to medication Assessment & Plan: Patient started to gain weight in her teens which correlates with the starting of paroxetine which is known to be obesogenic.  She tells me that her primary care doctor has referred her to mental health for treatment.  I recommend that she switch to a weight neutral SSRI like fluoxetine or escitalopram.  Also if there are no contraindications for Wellbutrin this may help her with weight loss and inattention.   Elevated hemoglobin A1c Assessment & Plan: Repeat hemoglobin A1c to make sure that she does not meet criteria for  diabetes.  Orders: -     Hemoglobin A1c -     Insulin, random  Class 3 severe obesity with serious comorbidity and body mass index (BMI) of 40.0 to 44.9 in adult, unspecified obesity type Mandy Whitehead) Assessment & Plan: She has complex obesity with genetic contributors, she is also on obesogenic medications, has an abnormal sleep routine, may have sleep apnea she is also close to having diabetes with an A1c of 6.4 so may have a high degree of insulin resistance all of these things combined are contributing to her weight gain.  We will start working on nutrition and increasing physical activity.  Mother and patient expressed interest in pharmacotherapy, she may be a candidate for GLP-1 considering degree of obesity and high risk complications as such a young age.  We will explore this further at subsequent appointments.     Follow-up  She was informed of the importance of frequent follow-up visits to maximize her success with intensive lifestyle modifications for her multiple health conditions. She was informed we would discuss her lab results at her next visit unless there is a critical issue that needs to be addressed sooner. Jasara agreed to keep her next visit at the agreed upon time to discuss these results.  Attestation Statement  This is the patient's intake visit at [facility]. The patient's Health Questionnaire was reviewed at length. Included in the packet: current and past health history, medications, allergies, ROS, gynecologic history (women only), surgical history, family history, social history, weight history, weight loss surgery history (for those that have had weight loss surgery), nutritional evaluation, mood and food questionnaire, PHQ9, Epworth questionnaire, sleep habits questionnaire, patient life and health improvement goals questionnaire. These will all be scanned into the patient's chart under media.   During the visit, I independently reviewed the patient's EKG,  previous labs, bioimpedance scale results, and indirect calorimetry results. I used this information to medically tailor a meal plan for the patient that will help her to lose weight and will improve her obesity-related conditions. I performed a medically necessary appropriate examination and/or evaluation. I discussed the assessment and treatment plan with the patient. The patient was provided an opportunity to ask questions and all were answered. The patient agreed  with the plan and demonstrated an understanding of the instructions. Labs were ordered at this visit and will be reviewed at the next visit unless critical results need to be addressed immediately. Clinical information was updated and documented in the EMR.   In addition, they received basic education on identification of processed foods and reduction of these, different sources of lean proteins and complex carbohydrates and how to eat balanced by incorporation of whole foods.  Reviewed by clinician on day of visit: allergies, medications, problem list, medical history, surgical history, family history, social history, and previous encounter notes.  I have spent 60 minutes in the care of the patient today including: preparing to see patient (e.g. review and interpretation of tests, old notes ), obtaining and/or reviewing separately obtained history, performing a medically appropriate examination or evaluation, counseling and educating the patient, ordering medications, test or procedures, documenting clinical information in the electronic or other health care record, and independently interpreting results and communicating results to the patient, family, or caregiver      Worthy Rancher, MD

## 2023-04-27 NOTE — Assessment & Plan Note (Signed)
Her most recent A1c was 6.4, and this is trending upward.  She is high risk for progression.  Her mother was recently diagnosed with diabetes as well.  They will be working on environment, increasing physical activity levels, reducing simple and added sugars in diet.  We are checking hemoglobin A1c today as well as insulin levels.  Her fasting blood glucose was 99 recently.  Patient and mother were educated on the carb insulin model of obesity and the importance of maintaining a diet with a low glycemic load.  We also reviewed balanced plate and differences between simple and complex carbs.

## 2023-04-27 NOTE — Assessment & Plan Note (Signed)
She has complex obesity with genetic contributors, she is also on obesogenic medications, has an abnormal sleep routine, may have sleep apnea she is also close to having diabetes with an A1c of 6.4 so may have a high degree of insulin resistance all of these things combined are contributing to her weight gain.  We will start working on nutrition and increasing physical activity.  Mother and patient expressed interest in pharmacotherapy, she may be a candidate for GLP-1 considering degree of obesity and high risk complications as such a young age.  We will explore this further at subsequent appointments.

## 2023-04-29 LAB — INSULIN, RANDOM: INSULIN: 33.4 u[IU]/mL — ABNORMAL HIGH (ref 2.6–24.9)

## 2023-04-29 LAB — VITAMIN D 25 HYDROXY (VIT D DEFICIENCY, FRACTURES): Vit D, 25-Hydroxy: 19.8 ng/mL — ABNORMAL LOW (ref 30.0–100.0)

## 2023-04-29 LAB — HEMOGLOBIN A1C
Est. average glucose Bld gHb Est-mCnc: 126 mg/dL
Hgb A1c MFr Bld: 6 % — ABNORMAL HIGH (ref 4.8–5.6)

## 2023-04-29 LAB — VITAMIN B12: Vitamin B-12: 541 pg/mL (ref 232–1245)

## 2023-05-10 ENCOUNTER — Ambulatory Visit (INDEPENDENT_AMBULATORY_CARE_PROVIDER_SITE_OTHER): Payer: 59 | Admitting: Internal Medicine

## 2023-05-10 ENCOUNTER — Encounter (INDEPENDENT_AMBULATORY_CARE_PROVIDER_SITE_OTHER): Payer: Self-pay | Admitting: Internal Medicine

## 2023-05-10 VITALS — BP 103/72 | HR 88 | Temp 98.1°F | Ht 63.0 in | Wt 252.0 lb

## 2023-05-10 DIAGNOSIS — T50905A Adverse effect of unspecified drugs, medicaments and biological substances, initial encounter: Secondary | ICD-10-CM

## 2023-05-10 DIAGNOSIS — E7800 Pure hypercholesterolemia, unspecified: Secondary | ICD-10-CM

## 2023-05-10 DIAGNOSIS — R7303 Prediabetes: Secondary | ICD-10-CM

## 2023-05-10 DIAGNOSIS — T43225A Adverse effect of selective serotonin reuptake inhibitors, initial encounter: Secondary | ICD-10-CM

## 2023-05-10 DIAGNOSIS — E559 Vitamin D deficiency, unspecified: Secondary | ICD-10-CM | POA: Diagnosis not present

## 2023-05-10 DIAGNOSIS — R29818 Other symptoms and signs involving the nervous system: Secondary | ICD-10-CM | POA: Diagnosis not present

## 2023-05-10 DIAGNOSIS — Z6841 Body Mass Index (BMI) 40.0 and over, adult: Secondary | ICD-10-CM

## 2023-05-10 DIAGNOSIS — E66813 Obesity, class 3: Secondary | ICD-10-CM

## 2023-05-10 DIAGNOSIS — E88819 Insulin resistance, unspecified: Secondary | ICD-10-CM | POA: Insufficient documentation

## 2023-05-10 MED ORDER — VITAMIN D (ERGOCALCIFEROL) 1.25 MG (50000 UNIT) PO CAPS: 50000.0000 [IU] | ORAL_CAPSULE | ORAL | 0 refills | Status: AC

## 2023-05-10 MED ORDER — METFORMIN HCL ER 500 MG PO TB24: 500.0000 mg | ORAL_TABLET | Freq: Every day | ORAL | 0 refills | Status: DC

## 2023-05-10 NOTE — Assessment & Plan Note (Signed)
Most recent LDL was 130.  This may be secondary to genetics, nutritional or spillover effect from excess adiposity.  She was counseled on maintaining a diet low in saturated fats at least less than 10% of calories.  Losing 10% of body weight may improve condition

## 2023-05-10 NOTE — Assessment & Plan Note (Signed)
Patient started to gain weight in her teens which correlates with the starting of paroxetine which is known to be obesogenic.  She tells me that her primary care doctor has referred her to mental health for treatment.  I recommend that she switch to a weight neutral SSRI like fluoxetine or escitalopram.  Also if there are no contraindications for Wellbutrin this may help her with weight loss and inattention.

## 2023-05-10 NOTE — Assessment & Plan Note (Addendum)
-   See obesity treatment plan - Continue with current weight management strategy. - Discussed adjustments to overcome identified barriers. - Reinforce dietary and exercise goals. - Follow-up in 4 weeks.

## 2023-05-10 NOTE — Assessment & Plan Note (Signed)
Most recent vitamin D levels  Lab Results  Component Value Date   VD25OH 19.8 (L) 04/27/2023     Deficiency state associated with adiposity and may result in leptin resistance, weight gain and fatigue.  She has been prescribed over-the-counter vitamin D but has not been taking.  We will do high-dose vitamin D supplementation with ergocalciferol 50,000 units 1 tablet weekly for a total of 4 months and then transition to over-the-counter supplementation

## 2023-05-10 NOTE — Assessment & Plan Note (Signed)
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 Optimal level < 1.9.   This is complex condition associated with genetics, ectopic fat and lifestyle factors. Insulin resistance may result in increased fat storage, inhibition of the breakdown of fat, cause fluctuations in blood sugar leading to energy crashes and increased cravings for sugary or high carb foods and cause metabolic slowdown making it difficult to lose weight.  This may result in additional weight gain and lead to pre-diabetes and diabetes if untreated. In addition, hyperinsulinemia increases cardiovascular risk, chronic inflammatory response and may increase the risk of obesity related malignancies.  Lab Results  Component Value Date   HGBA1C 6.0 (H) 04/27/2023   Lab Results  Component Value Date   INSULIN 33.4 (H) 04/27/2023   Lab Results  Component Value Date   GLUCOSE 99 04/06/2023   GLUCOSE 93 07/10/2019    We reviewed treatment options which include losing 7 to 10% of body weight, increasing volume of physical activity and maintaining a diet low in saturated fats and with a low glycemic load.  Patient has also been educated on the carb insulin model of obesity.  After discussion of benefits and side effect she will be started on metformin XR 500 mg 1 tablet daily for pharmacoprophylaxis.  She may also benefit from GLP-1

## 2023-05-10 NOTE — Assessment & Plan Note (Signed)
Patient has symptoms of sleep disordered breathing and high risk phenotype. Counseled on risks associated with undiagnosed / untreated disorder. Recommend she have [facility]. Losing 15% of BW may improve symptoms.  She will be referred to [facility] for sleep evaluation.

## 2023-05-10 NOTE — Assessment & Plan Note (Signed)
Most recent A1c is  Lab Results  Component Value Date   HGBA1C 6.0 (H) 04/27/2023   HGBA1C 5.9 01/08/2021    Patient aware of disease state and risk of progression. This may contribute to abnormal cravings, fatigue and diabetic complications without having diabetes.   We have discussed treatment options which include: losing 7 to 10% of body weight, increasing physical activity to a goal of 150 minutes a week at moderate intensity.  Advised to maintain a diet low on simple and processed carbohydrates.  After discussion of benefits and side effects will be started on metformin XR 500 mg 1 tablet daily for pharmacoprophylaxis.  She may also be a candidate for GLP-1

## 2023-05-10 NOTE — Progress Notes (Signed)
Office: [phone]  /  Fax: [phone]  Weight Summary And Biometrics  Vitals Temp: 98.1 F (36.7 C) BP: 103/72 Pulse Rate: 88 SpO2: 97 %   Anthropometric Measurements Height: 5\' 3"  (1.6 m) Weight: 252 lb (114.3 kg) BMI (Calculated): 44.65 Weight at Last Visit: 254 lb Weight Lost Since Last Visit: 2 lb Weight Gained Since Last Visit: 0 lb Starting Weight: 254 lb Total Weight Loss (lbs): 2 lb (0.907 kg) Peak Weight: 26 lb   Body Composition  Body Fat %: 49.6 % Fat Mass (lbs): 125.4 lbs Muscle Mass (lbs): 121 lbs Total Body Water (lbs): 88.2 lbs Visceral Fat Rating : 13    RMR: [id number]  Today's Visit #: 2  Starting Date: 04/27/23   Subjective   Chief Complaint: Obesity  Mandy Whitehead is here to discuss her progress with her obesity treatment plan. She is on the the Category 3 Plan and states she is following her eating plan approximately 60 % of the time. She states she is not exercising..  Weight Progress Since Last Visit:  Since last office visit she has lost 2 pounds. She reports fair adherence to reduced calorie nutritional plan. She has been working on not skipping meals, increasing protein intake at every meal, eating more fruits, eating more vegetables, drinking more water, avoiding and / or reducing liquid calories, avoiding or reducing simple and processed carbohydrates, making healthier choices, and incorporating more whole foods   Challenges affecting patient progress: low volume of physical activity at present  and presence of obesogenic drugs.   Orexigenic Control: Denies problems with appetite and hunger signals.  Denies problems with satiety and satiation.  Denies problems with eating patterns and portion control.  Denies abnormal cravings. Denies feeling deprived or restricted.   Pharmacotherapy for weight management: She is currently taking no anti-obesity medication.   Assessment and Plan   Treatment Plan For Obesity:  Recommended Dietary  Goals  Chris is currently in the action stage of change. As such, her goal is to continue weight management plan. She has agreed to: continue current plan  Behavioral Health and Counseling  We discussed the following behavioral modification strategies today: continue to work on maintaining a reduced calorie state, getting the recommended amount of protein, incorporating whole foods, making healthy choices, staying well hydrated and practicing mindfulness when eating..  Additional education and resources provided today: Handout on the benefits and side effects of metformin in weight management  Recommended Physical Activity Goals  Kita has been advised to work up to 150 minutes of moderate intensity aerobic activity a week and strengthening exercises 2-3 times per week for cardiovascular health, weight loss maintenance and preservation of muscle mass.   She has agreed to :  Think about enjoyable ways to increase daily physical activity and overcoming barriers to exercise and Increase physical activity in their day and reduce sedentary time (increase NEAT).  She is considering trying yoga watching YouTube videos.  She has a fitness center where she lives but does not like to exercise.  Pharmacotherapy  We discussed various medication options to help Rishita with her weight loss efforts and we both agreed to :  After discussion of benefits and side effect she will be started on metformin XR 500 mg once a day for diabetes prevention and weight management  Associated Conditions Impacted by Obesity Treatment  Prediabetes Assessment & Plan: Most recent A1c is  Lab Results  Component Value Date   HGBA1C 6.0 (H) 04/27/2023   HGBA1C 5.9 01/08/2021  Patient aware of disease state and risk of progression. This may contribute to abnormal cravings, fatigue and diabetic complications without having diabetes.   We have discussed treatment options which include: losing 7 to 10% of body weight,  increasing physical activity to a goal of 150 minutes a week at moderate intensity.  Advised to maintain a diet low on simple and processed carbohydrates.  After discussion of benefits and side effects will be started on metformin XR 500 mg 1 tablet daily for pharmacoprophylaxis.  She may also be a candidate for GLP-1   Orders: -     metFORMIN HCl ER; Take 1 tablet (500 mg total) by mouth daily with breakfast.  Dispense: 30 tablet; Refill: 0  Vitamin D deficiency Assessment & Plan: Most recent vitamin D levels  Lab Results  Component Value Date   VD25OH 19.8 (L) 04/27/2023     Deficiency state associated with adiposity and may result in leptin resistance, weight gain and fatigue.  She has been prescribed over-the-counter vitamin D but has not been taking.  We will do high-dose vitamin D supplementation with ergocalciferol 50,000 units 1 tablet weekly for a total of 4 months and then transition to over-the-counter supplementation     Orders: -     Vitamin D (Ergocalciferol); Take 1 capsule (50,000 Units total) by mouth every 7 (seven) days.  Dispense: 16 capsule; Refill: 0  Suspected sleep apnea Assessment & Plan: Patient has symptoms of sleep disordered breathing and high risk phenotype. Counseled on risks associated with undiagnosed / untreated disorder. Recommend she have PSMG. Losing 15% of BW may improve symptoms.  She will be referred to [facility] for sleep evaluation.   Orders: -     Ambulatory referral to Sleep Studies  Insulin resistance Assessment & Plan: Her HOMA-IR is 8.06 which is elevated. Optimal level < 1.9.   This is complex condition associated with genetics, ectopic fat and lifestyle factors. Insulin resistance may result in increased fat storage, inhibition of the breakdown of fat, cause fluctuations in blood sugar leading to energy crashes and increased cravings for sugary or high carb foods and cause metabolic slowdown making it  difficult to lose weight.  This may result in additional weight gain and lead to pre-diabetes and diabetes if untreated. In addition, hyperinsulinemia increases cardiovascular risk, chronic inflammatory response and may increase the risk of obesity related malignancies.  Lab Results  Component Value Date   HGBA1C 6.0 (H) 04/27/2023   Lab Results  Component Value Date   INSULIN 33.4 (H) 04/27/2023   Lab Results  Component Value Date   GLUCOSE 99 04/06/2023   GLUCOSE 93 07/10/2019    We reviewed treatment options which include losing 7 to 10% of body weight, increasing volume of physical activity and maintaining a diet low in saturated fats and with a low glycemic load.  Patient has also been educated on the carb insulin model of obesity.  After discussion of benefits and side effect she will be started on metformin XR 500 mg 1 tablet daily for pharmacoprophylaxis.  She may also benefit from GLP-1    Pure hypercholesterolemia Assessment & Plan: Most recent LDL was 130.  This may be secondary to genetics, nutritional or spillover effect from excess adiposity.  She was counseled on maintaining a diet low in saturated fats at least less than 10% of calories.  Losing 10% of body weight may improve condition   Weight gain due to medication Assessment & Plan: Patient started to gain  weight in her teens which correlates with the starting of paroxetine which is known to be obesogenic.  She tells me that her primary care doctor has referred her to mental health for treatment.  I recommend that she switch to a weight neutral SSRI like fluoxetine or escitalopram.  Also if there are no contraindications for Wellbutrin this may help her with weight loss and inattention.   Class 3 severe obesity with serious comorbidity and body mass index (BMI) of 40.0 to 44.9 in adult, unspecified obesity type [facility]) Assessment & Plan: - See obesity treatment plan - Continue with current weight management  strategy. - Discussed adjustments to overcome identified barriers. - Reinforce dietary and exercise goals. - Follow-up in 4 weeks.       Objective   Physical Exam:  Blood pressure 103/72, pulse 88, temperature 98.1 F (36.7 C), height 5\' 3"  (1.6 m), weight 252 lb (114.3 kg), last menstrual period 03/30/2023, SpO2 97%. Body mass index is 44.64 kg/m.  General: She is overweight, cooperative, alert, well developed, and in no acute distress. PSYCH: Has normal mood, affect and thought process.   HEENT: EOMI, sclerae are anicteric. Lungs: Normal breathing effort, no conversational dyspnea. Extremities: No edema.  Neurologic: No gross sensory or motor deficits. No tremors or fasciculations noted.    Diagnostic Data Reviewed:  BMET    Component Value Date/Time   NA 135 04/06/2023 1042   K 3.6 04/06/2023 1042   CL 104 04/06/2023 1042   CO2 24 04/06/2023 1042   GLUCOSE 99 04/06/2023 1042   BUN 7 04/06/2023 1042   CREATININE 0.72 04/06/2023 1042   CALCIUM 9.0 04/06/2023 1042   Lab Results  Component Value Date   HGBA1C 6.0 (H) 04/27/2023   HGBA1C 5.9 01/08/2021   Lab Results  Component Value Date   INSULIN 33.4 (H) 04/27/2023   Lab Results  Component Value Date   TSH 2.33 04/06/2023   CBC    Component Value Date/Time   WBC 8.2 04/06/2023 1042   RBC 4.40 04/06/2023 1042   HGB 11.7 (L) 04/06/2023 1042   HCT 36.1 04/06/2023 1042   PLT 297.0 04/06/2023 1042   MCV 81.9 04/06/2023 1042   MCHC 32.4 04/06/2023 1042   RDW 14.7 04/06/2023 1042   Iron Studies    Component Value Date/Time   IRON 92 04/06/2023 1042   TIBC 367 04/06/2023 1042   FERRITIN 24 04/06/2023 1042   IRONPCTSAT 25 04/06/2023 1042   Lipid Panel     Component Value Date/Time   CHOL 199 04/06/2023 1042   TRIG 85.0 04/06/2023 1042   HDL 52.10 04/06/2023 1042   CHOLHDL 4 04/06/2023 1042   VLDL 17.0 04/06/2023 1042   LDLCALC 130 (H) 04/06/2023 1042   Hepatic Function Panel     Component  Value Date/Time   PROT 7.1 04/06/2023 1042   ALBUMIN 3.7 04/06/2023 1042   AST 17 04/06/2023 1042   ALT 14 04/06/2023 1042   ALKPHOS 79 04/06/2023 1042   BILITOT 0.4 04/06/2023 1042      Component Value Date/Time   TSH 2.33 04/06/2023 1042   Nutritional Lab Results  Component Value Date   VD25OH 19.8 (L) 04/27/2023    Follow-Up   Return in about 3 weeks (around 05/31/2023) for For Weight Mangement with Dr. Rikki Spearing.Marland Kitchen She was informed of the importance of frequent follow up visits to maximize her success with intensive lifestyle modifications for her multiple health conditions.  Attestation Statement   Reviewed by clinician on  day of visit: allergies, medications, problem list, medical history, surgical history, family history, social history, and previous encounter notes.   I have spent 40 minutes in the care of the patient today including: preparing to see patient (e.g. review and interpretation of tests, old notes ), obtaining and/or reviewing separately obtained history, performing a medically appropriate examination or evaluation, counseling and educating the patient, ordering medications, test or procedures, documenting clinical information in the electronic or other health care record, and independently interpreting results and communicating results to the patient, family, or caregiver   Worthy Rancher, MD

## 2023-06-06 ENCOUNTER — Encounter: Payer: Self-pay | Admitting: Neurology

## 2023-06-06 ENCOUNTER — Ambulatory Visit (INDEPENDENT_AMBULATORY_CARE_PROVIDER_SITE_OTHER): Payer: 59 | Admitting: Neurology

## 2023-06-06 VITALS — BP 118/64 | HR 83 | Ht 64.0 in | Wt 250.0 lb

## 2023-06-06 DIAGNOSIS — Z820 Family history of epilepsy and other diseases of the nervous system: Secondary | ICD-10-CM

## 2023-06-06 DIAGNOSIS — Z9189 Other specified personal risk factors, not elsewhere classified: Secondary | ICD-10-CM | POA: Diagnosis not present

## 2023-06-06 DIAGNOSIS — G4719 Other hypersomnia: Secondary | ICD-10-CM

## 2023-06-06 DIAGNOSIS — G4763 Sleep related bruxism: Secondary | ICD-10-CM

## 2023-06-06 DIAGNOSIS — R0683 Snoring: Secondary | ICD-10-CM | POA: Diagnosis not present

## 2023-06-06 DIAGNOSIS — R351 Nocturia: Secondary | ICD-10-CM

## 2023-06-06 NOTE — Patient Instructions (Signed)

## 2023-06-06 NOTE — Progress Notes (Signed)
 Subjective:    Patient ID: Mandy Whitehead is a 23 y.o. female.  HPI    Huston Foley, MD, PhD [facility] [address], [address] [address] [place], [place] [address]  Dear Dr. Rikki Spearing,  I saw your patient, Mandy Whitehead, upon your kind request in my sleep clinic today for initial consultation of her sleep disorder, in particular, concern for underlying obstructive sleep apnea.  The patient is accompanied by her mother today.  As you know, Ms. Raney is a 23 year old female with an underlying medical history of autism spectrum disorder, OCD, ADHD, insulin resistance, vitamin D deficiency, prediabetes, hyperlipidemia, and severe obesity with a BMI of over 40, who reports snoring and excessive daytime somnolence.  Her Epworth sleepiness score is 5 out of 24, fatigue severity score is 52 out of 63.  I reviewed your office note from 05/10/2023.  He has trouble maintaining sleep.  She has some trouble going to sleep.  She tried trazodone but this was recently switched by her psychiatrist to clonidine.  She is no longer on paroxetine and recently started Prozac low-dose and an increased to 20 mg planned in the near future.  Patient has a bedtime of 11 and rise time between 8 and 11.  She does not currently work.  She is having some trouble losing weight.  She uses white noise in her room on a phone app, she does have a TV in her bedroom but it is not on at night.  She uses blackout curtains.  She is a non-smoker and does not drink any alcohol.  She does not vape any nicotine or smoke THC products.  She has a family history of sleep apnea affecting her dad.  She denies recurrent morning headaches but has nocturia about her average night.  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  Her Past Medical History Is Significant For: History reviewed. No pertinent past medical history.  Her Past Surgical History Is Significant For: Past Surgical History:  Procedure Laterality Date    WISDOM TOOTH EXTRACTION      Her Family History Is Significant For: Family History  Problem Relation Age of Onset   Miscarriages / [place] Mother    High blood pressure Father    Arthritis Maternal Grandmother    Heart disease Maternal Grandmother    Arthritis Maternal Grandfather    High blood pressure Maternal Grandfather    Asthma Paternal Grandmother    Diabetes Paternal Grandmother    Heart disease Paternal Grandmother    High blood pressure Paternal Grandmother    High Cholesterol Paternal Grandmother    Kidney disease Paternal Grandmother    Cancer Paternal Grandfather     Her Social History Is Significant For: Social History   Socioeconomic History   Marital status: Single    Spouse name: Not on file   Number of children: Not on file   Years of education: Not on file   Highest education level: Not on file  Occupational History   Not on file  Tobacco Use   Smoking status: Never   Smokeless tobacco: Never  Vaping Use   Vaping status: Never Used  Substance and Sexual Activity   Alcohol use: Never   Drug use: Never   Sexual activity: Never  Other Topics Concern   Not on file  Social History Narrative   Not on file   Social Drivers of Health   Financial Resource Strain: Not on file  Food Insecurity: Not on  file  Transportation Needs: Not on file  Physical Activity: Not on file  Stress: Not on file  Social Connections: Unknown (07/29/2022)   Received from [facility]   Social Network    Social Network: Not on file    Her Allergies Are:  Allergies  Allergen Reactions   Penicillin G Itching, Swelling and Hives  :   Her Current Medications Are:  Outpatient Encounter Medications as of 06/06/2023  Medication Sig   cloNIDine (CATAPRES) 0.2 MG tablet Take 0.2 mg by mouth at bedtime.   Dapsone 5 % topical gel Apply topically 2 (two) times daily.   Evening Primrose Oil 1000 MG CAPS Take 1 capsule by mouth daily.   Ferrous Sulfate (IRON) 325 (65 Fe)  MG TABS Take by mouth.   FLUoxetine (PROZAC) 10 MG capsule Take 10 mg by mouth daily.   fluticasone (FLONASE) 50 MCG/ACT nasal spray USE 1 SPRAY IN EACH NOSTRIL DAILY   Magnesium 100 MG CAPS Take by mouth.   Magnesium Glycinate ([employer] MAGNESIUM GLYCINATE) 100 MG CAPS Take 400 mg by mouth as needed.   metFORMIN (GLUCOPHAGE-XR) 500 MG 24 hr tablet Take 1 tablet (500 mg total) by mouth daily with breakfast.   Multiple Vitamin (MULTIVITAMIN) capsule Take 1 capsule by mouth daily.   Norgestimate-Ethinyl Estradiol Triphasic 0.18/0.215/0.25 MG-35 MCG tablet Take 1 tablet by mouth daily. From dermatology for Acne   UNABLE TO FIND daily. Med Name: Nutri Calm Nervous System Support   vitamin C (ASCORBIC ACID) 500 MG tablet Take 1,000 mg by mouth daily.   Vitamin D, Ergocalciferol, (DRISDOL) 1.25 MG (50000 UNIT) CAPS capsule Take 1 capsule (50,000 Units total) by mouth every 7 (seven) days.   hydrOXYzine (ATARAX/VISTARIL) 10 MG tablet Take 1-2 tablets (10-20 mg total) by mouth at bedtime as needed. (Patient not taking: Reported on 06/06/2023)   PARoxetine (PAXIL) 20 MG tablet TAKE 1 TABLET DAILY (Patient not taking: Reported on 06/06/2023)   traZODone (DESYREL) 50 MG tablet Take 0.5-1 tablets (25-50 mg total) by mouth at bedtime as needed for sleep. (Patient not taking: Reported on 06/06/2023)   No facility-administered encounter medications on file as of 06/06/2023.  :   Review of Systems:  Out of a complete 14 point review of systems, all are reviewed and negative with the exception of these symptoms as listed below:   Review of Systems  Neurological:        Pt in Rm #5 with her mother. Patient states she has trouble sleeping and wakes up in the middle of the night. Patient states she snores and grinds her teeth while sleeping. Patient states she has trouble with weight loss. ESS-5, FSS-52    Objective:  Neurological Exam  Physical Exam Physical Examination:   Vitals:   06/06/23 1133  BP: 118/64   Pulse: 83    General Examination: The patient is a very pleasant 23 y.o. female in no acute distress. She appears well-developed and well-nourished and well groomed.   HEENT: Normocephalic, atraumatic, pupils are equal, round and reactive to light, extraocular tracking is good without limitation to gaze excursion or nystagmus noted. Hearing is grossly intact. Face is symmetric with normal facial animation. Speech is clear with no dysarthria noted. There is no hypophonia. There is no lip, neck/head, jaw or voice tremor. Neck is supple with full range of passive and active motion. There are no carotid bruits on auscultation. Oropharynx exam reveals: mild mouth dryness, adequate dental hygiene and moderate airway crowding, due to small airway  entry, Mallampati class III, tonsillar size 1-2+ on the right and 1+ on the left.  Tongue protrudes centrally and palate elevates symmetrically, neck circumference 15-5/8 inches, no significant overbite and very slight crossbite noted.  Chest: Clear to auscultation without wheezing, rhonchi or crackles noted.  Heart: S1+S2+0, regular and normal without murmurs, rubs or gallops noted.   Abdomen: Soft, non-tender and non-distended.  Extremities: There is no pitting edema in the distal lower extremities bilaterally.   Skin: Warm and dry without trophic changes noted.   Musculoskeletal: exam reveals no obvious joint deformities.   Neurologically:  Mental status: The patient is awake, alert and oriented in all 4 spheres. Her immediate and remote memory, attention, language skills and fund of knowledge are appropriate. There is no evidence of aphasia, agnosia, apraxia or anomia. Speech is clear with normal prosody and enunciation. Thought process is linear. Mood is normal and affect is normal.  Cranial nerves II - XII are as described above under HEENT exam.  Motor exam: Normal bulk, strength and tone is noted. There is no obvious action or resting tremor.  Fine  motor skills and coordination: grossly intact.  Cerebellar testing: No dysmetria or intention tremor. There is no truncal or gait ataxia.  Sensory exam: intact to light touch in the upper and lower extremities.  Gait, station and balance: She stands easily. No veering to one side is noted. No leaning to one side is noted. Posture is age-appropriate and stance is narrow based. Gait shows normal stride length and normal pace. No problems turning are noted.   Assessment and Plan:  In summary, Sarah-Jane Nazario is a very pleasant 23 year old female with an underlying medical history of autism spectrum disorder, OCD, ADHD, insulin resistance, vitamin D deficiency, prediabetes, hyperlipidemia, and severe obesity with a BMI of over 40, whose history and physical exam are concerning for sleep disordered breathing, particularly obstructive sleep apnea (OSA).  While a laboratory attended sleep study is typically considered "gold standard" for evaluation of sleep disordered breathing, we mutually agreed to proceed with a home sleep test at this time.   I had a long chat with the patient and her mom about my findings and the diagnosis of sleep apnea, particularly OSA, its prognosis and treatment options. We talked about medical/conservative treatments, surgical interventions and non-pharmacological approaches for symptom control. I explained, in particular, the risks and ramifications of untreated moderate to severe OSA, especially with respect to developing cardiovascular disease down the road, including congestive heart failure (CHF), difficult to treat hypertension, cardiac arrhythmias (particularly A-fib), neurovascular complications including TIA, stroke and dementia. Even type 2 diabetes has, in part, been linked to untreated OSA. Symptoms of untreated OSA may include (but may not be limited to) daytime sleepiness, nocturia (i.e. frequent nighttime urination), memory problems, mood irritability and suboptimally  controlled or worsening mood disorder such as depression and/or anxiety, lack of energy, lack of motivation, physical discomfort, as well as recurrent headaches, especially morning or nocturnal headaches. We talked about the importance of maintaining a healthy lifestyle and striving for healthy weight. In addition, we talked about the importance of striving for and maintaining good sleep hygiene. I recommended a sleep study at this time. I outlined the differences between a laboratory attended sleep study which is considered more comprehensive and accurate over the option of a home sleep test (HST); the latter may lead to underestimation of sleep disordered breathing in some instances and does not help with diagnosing upper airway resistance syndrome and is not  accurate enough to diagnose primary central sleep apnea typically. I outlined possible surgical and non-surgical treatment options of OSA, including the use of a positive airway pressure (PAP) device (i.e. CPAP, AutoPAP/APAP or BiPAP in certain circumstances), a custom-made dental device (aka oral appliance, which would require a referral to a specialist dentist or orthodontist typically, and is generally speaking not considered for patients with full dentures or edentulous state), upper airway surgical options, such as traditional UPPP (which is not considered a first-line treatment) or the Inspire device (hypoglossal nerve stimulator, which would involve a referral for consultation with an ENT surgeon, after careful selection, following inclusion criteria - also not first-line treatment). I explained the PAP treatment option to the patient in detail, as this is generally considered first-line treatment.  The patient indicated that she would be willing to try PAP therapy, if the need arises. I explained the importance of being compliant with PAP treatment, not only for insurance purposes but primarily to improve patient's symptoms symptoms, and for the  patient's long term health benefit, including to reduce Her cardiovascular risks longer-term.    We will pick up our discussion about the next steps and treatment options after testing.  We will keep her posted as to the test results by phone call and/or MyChart messaging where possible.  We will plan to follow-up in sleep clinic accordingly as well.  I answered all their questions today and the patient and her mother were in agreement.   I encouraged them to call with any interim questions, concerns, problems or updates or email [place] through MyChart.  Generally speaking, sleep test authorizations may take up to 2 weeks, sometimes less, sometimes longer, the patient is encouraged to get in touch with [place] if they do not hear back from the sleep lab staff directly within the next 2 weeks.  Thank you very much for allowing me to participate in the care of this nice patient. If I can be of any further assistance to you please do not hesitate to call me at [phone].  Sincerely,   Huston Foley, MD, PhD

## 2023-06-07 ENCOUNTER — Telehealth: Payer: Self-pay | Admitting: Neurology

## 2023-06-07 NOTE — Telephone Encounter (Signed)
 HST [employer] no auth req & [employer] no auth req via fax

## 2023-06-07 NOTE — Telephone Encounter (Signed)
 HST [employer] no auth req & [employer] pending

## 2023-06-08 ENCOUNTER — Ambulatory Visit (INDEPENDENT_AMBULATORY_CARE_PROVIDER_SITE_OTHER): Payer: 59 | Admitting: Internal Medicine

## 2023-06-08 ENCOUNTER — Encounter (INDEPENDENT_AMBULATORY_CARE_PROVIDER_SITE_OTHER): Payer: Self-pay | Admitting: Internal Medicine

## 2023-06-08 VITALS — BP 85/53 | HR 77 | Temp 98.6°F | Ht 64.0 in | Wt 249.0 lb

## 2023-06-08 DIAGNOSIS — I952 Hypotension due to drugs: Secondary | ICD-10-CM

## 2023-06-08 DIAGNOSIS — T465X5A Adverse effect of other antihypertensive drugs, initial encounter: Secondary | ICD-10-CM

## 2023-06-08 DIAGNOSIS — R29818 Other symptoms and signs involving the nervous system: Secondary | ICD-10-CM | POA: Diagnosis not present

## 2023-06-08 DIAGNOSIS — E88819 Insulin resistance, unspecified: Secondary | ICD-10-CM

## 2023-06-08 DIAGNOSIS — Z6841 Body Mass Index (BMI) 40.0 and over, adult: Secondary | ICD-10-CM

## 2023-06-08 DIAGNOSIS — R7303 Prediabetes: Secondary | ICD-10-CM | POA: Diagnosis not present

## 2023-06-08 DIAGNOSIS — E66813 Obesity, class 3: Secondary | ICD-10-CM

## 2023-06-08 DIAGNOSIS — R635 Abnormal weight gain: Secondary | ICD-10-CM

## 2023-06-08 MED ORDER — METFORMIN HCL ER 500 MG PO TB24: 500.0000 mg | ORAL_TABLET | Freq: Two times a day (BID) | ORAL | 0 refills | Status: DC

## 2023-06-08 NOTE — Assessment & Plan Note (Signed)
 So far she has lost 11 pounds.  She is no longer on Paxil and continues to work on implementing reduced calorie nutrition plan.  She is currently on metformin for diabetes prevention and weight management.  She does not have coverage for GLP-1.  Continue current weight management strategy.  She will continue to work on increasing physical activity levels

## 2023-06-08 NOTE — Assessment & Plan Note (Signed)
 Patient has sleep consultation which I have reviewed.  She is in the process of determining which polysomnography will be covered.  Follow-up with sleep specialist.  She is also working on her sleep hygiene along with her psychiatrist.

## 2023-06-08 NOTE — Assessment & Plan Note (Signed)
 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 Optimal level < 1.9.   This is complex condition associated with genetics, ectopic fat and lifestyle factors. Insulin resistance may result in increased fat storage, inhibition of the breakdown of fat, cause fluctuations in blood sugar leading to energy crashes and increased cravings for sugary or high carb foods and cause metabolic slowdown making it difficult to lose weight.  This may result in additional weight gain and lead to pre-diabetes and diabetes if untreated. In addition, hyperinsulinemia increases cardiovascular risk, chronic inflammatory response and may increase the risk of obesity related malignancies.  Lab Results  Component Value Date   HGBA1C 6.0 (H) 04/27/2023   Lab Results  Component Value Date   INSULIN 33.4 (H) 04/27/2023   Lab Results  Component Value Date   GLUCOSE 99 04/06/2023   GLUCOSE 93 07/10/2019    We reviewed treatment options which include losing 7 to 10% of body weight, increasing volume of physical activity and maintaining a diet low in saturated fats and with a low glycemic load.  Patient has also been educated on the carb insulin model of obesity.  She is currently on metformin XR 500 mg once daily this will be increased to twice a day for pharmacoprophylaxis.

## 2023-06-08 NOTE — Progress Notes (Signed)
 Office: [phone]  /  Fax: [phone]  Weight Summary And Biometrics  Vitals Temp: 98.6 F (37 C) BP: (!) 85/53 (mother's request) Pulse Rate: 77 SpO2: 100 %   Anthropometric Measurements Height: 5\' 4"  (1.626 m) Weight: 249 lb (112.9 kg) BMI (Calculated): 42.72 Weight at Last Visit: 252 lb Weight Lost Since Last Visit: 3 lb Weight Gained Since Last Visit: 0 Starting Weight: 254 lb Total Weight Loss (lbs): 4 lb (1.814 kg) Peak Weight: 260 lb Waist Measurement : 46 inches   Body Composition  Body Fat %: 49.1 % Fat Mass (lbs): 122.2 lbs Muscle Mass (lbs): 120.4 lbs Total Body Water (lbs): 87.8 lbs Visceral Fat Rating : 13    RMR: 1901  Today's Visit #: 301  Starting Date: 03/31/23   Subjective   Chief Complaint: Obesity  Briar is here to discuss her progress with her obesity treatment plan. She is on the the Category 3 Plan and states she is following her eating plan approximately 40-50% of the time. She states she is exercising 15-30 minutes 2 times per week.  Weight Progress Since Last Visit:  Since last office visit she has lost 3 pounds. She reports fair adherence to reduced calorie nutritional plan. She has been working on increasing protein intake at every meal, eating more vegetables, drinking more water, avoiding and / or reducing liquid calories, avoiding or reducing simple and processed carbohydrates, making healthier choices, reducing portion sizes, and incorporating more whole foods   Challenges affecting patient progress: low volume of physical activity at present .   Orexigenic Control: Reports improved problems with appetite and hunger signals.  Reports improved problems with satiety and satiation.  Denies problems with eating patterns and portion control.  Denies abnormal cravings. Denies feeling deprived or restricted.   Pharmacotherapy for weight management: She is currently taking Metformin (off label use for incretin effect and /  or insulin resistance and / or diabetes prevention) with adequate clinical response  and without side effects..   Assessment and Plan   Treatment Plan For Obesity:  Recommended Dietary Goals  Mysha is currently in the action stage of change. As such, her goal is to continue weight management plan. She has agreed to: continue current plan  Behavioral Health and Counseling  We discussed the following behavioral modification strategies today: increasing lean protein intake to established goals and avoiding skipping meals.  Additional education and resources provided today: None  Recommended Physical Activity Goals  Aniston has been advised to work up to 150 minutes of moderate intensity aerobic activity a week and strengthening exercises 2-3 times per week for cardiovascular health, weight loss maintenance and preservation of muscle mass.   She has agreed to :  Increase the intensity, frequency or duration of strengthening exercises  and Increase the intensity, frequency or duration of aerobic exercises    Pharmacotherapy  We discussed various medication options to help Malaiah with her weight loss efforts and we both agreed to : increase metformin xr 500 mg twice a day  Associated Conditions Impacted by Obesity Treatment  Hypotension due to drugs Assessment & Plan: Patient had been prescribed clonidine 0.2 mg at night by mental health care provider she is noticing some queasiness and lightheadedness.  Her blood pressure this morning is in the 80s.  She will reach out to her prescribing physician today so they can adjust her clonidine.  She will continue to be monitored for orthostatic hypotension.  Patient advised on fall prevention and also maintaining adequate  hydration.   Prediabetes Assessment & Plan: Most recent A1c is  Lab Results  Component Value Date   HGBA1C 6.0 (H) 04/27/2023   HGBA1C 5.9 01/08/2021    Patient aware of disease state and risk of progression. This may  contribute to abnormal cravings, fatigue and diabetic complications without having diabetes.   We have discussed treatment options which include: losing 7 to 10% of body weight, increasing physical activity to a goal of 150 minutes a week at moderate intensity.  Advised to maintain a diet low on simple and processed carbohydrates.  We will increase metformin XR to 500 mg 1 tablet twice a day   Orders: -     metFORMIN HCl ER; Take 1 tablet (500 mg total) by mouth 2 (two) times daily with a meal.  Dispense: 60 tablet; Refill: 0  Insulin resistance Assessment & Plan: Her HOMA-IR is 8.06 which is elevated. Optimal level < 1.9.   This is complex condition associated with genetics, ectopic fat and lifestyle factors. Insulin resistance may result in increased fat storage, inhibition of the breakdown of fat, cause fluctuations in blood sugar leading to energy crashes and increased cravings for sugary or high carb foods and cause metabolic slowdown making it difficult to lose weight.  This may result in additional weight gain and lead to pre-diabetes and diabetes if untreated. In addition, hyperinsulinemia increases cardiovascular risk, chronic inflammatory response and may increase the risk of obesity related malignancies.  Lab Results  Component Value Date   HGBA1C 6.0 (H) 04/27/2023   Lab Results  Component Value Date   INSULIN 33.4 (H) 04/27/2023   Lab Results  Component Value Date   GLUCOSE 99 04/06/2023   GLUCOSE 93 07/10/2019    We reviewed treatment options which include losing 7 to 10% of body weight, increasing volume of physical activity and maintaining a diet low in saturated fats and with a low glycemic load.  Patient has also been educated on the carb insulin model of obesity.  She is currently on metformin XR 500 mg once daily this will be increased to twice a day for pharmacoprophylaxis.    Class 3 severe obesity with serious comorbidity and body mass index (BMI) of  40.0 to 44.9 in adult, unspecified obesity type [facility]) Assessment & Plan: So far she has lost 11 pounds.  She is no longer on Paxil and continues to work on implementing reduced calorie nutrition plan.  She is currently on metformin for diabetes prevention and weight management.  She does not have coverage for GLP-1.  Continue current weight management strategy.  She will continue to work on increasing physical activity levels   Suspected sleep apnea Assessment & Plan: Patient has sleep consultation which I have reviewed.  She is in the process of determining which polysomnography will be covered.  Follow-up with sleep specialist.  She is also working on her sleep hygiene along with her psychiatrist.   Weight gain due to medication Assessment & Plan: Her Paxil has been changed to fluoxetine which should help with her weight loss.  Continue to follow-up with psychiatry for medication adjustments      Objective   Physical Exam:  Blood pressure (!) 85/53, pulse 77, temperature 98.6 F (37 C), height 5\' 4"  (1.626 m), weight 249 lb (112.9 kg), SpO2 100%. Body mass index is 42.74 kg/m.  General: She is overweight, cooperative, alert, well developed, and in no acute distress. PSYCH: Has normal mood, affect and thought process.   HEENT: EOMI,  sclerae are anicteric. Lungs: Normal breathing effort, no conversational dyspnea. Extremities: No edema.  Neurologic: No gross sensory or motor deficits. No tremors or fasciculations noted.    Diagnostic Data Reviewed:  BMET    Component Value Date/Time   NA 135 04/06/2023 1042   K 3.6 04/06/2023 1042   CL 104 04/06/2023 1042   CO2 24 04/06/2023 1042   GLUCOSE 99 04/06/2023 1042   BUN 7 04/06/2023 1042   CREATININE 0.72 04/06/2023 1042   CALCIUM 9.0 04/06/2023 1042   Lab Results  Component Value Date   HGBA1C 6.0 (H) 04/27/2023   HGBA1C 5.9 01/08/2021   Lab Results  Component Value Date   INSULIN 33.4 (H) 04/27/2023   Lab Results   Component Value Date   TSH 2.33 04/06/2023   CBC    Component Value Date/Time   WBC 8.2 04/06/2023 1042   RBC 4.40 04/06/2023 1042   HGB 11.7 (L) 04/06/2023 1042   HCT 36.1 04/06/2023 1042   PLT 297.0 04/06/2023 1042   MCV 81.9 04/06/2023 1042   MCHC 32.4 04/06/2023 1042   RDW 14.7 04/06/2023 1042   Iron Studies    Component Value Date/Time   IRON 92 04/06/2023 1042   TIBC 367 04/06/2023 1042   FERRITIN 24 04/06/2023 1042   IRONPCTSAT 25 04/06/2023 1042   Lipid Panel     Component Value Date/Time   CHOL 199 04/06/2023 1042   TRIG 85.0 04/06/2023 1042   HDL 52.10 04/06/2023 1042   CHOLHDL 4 04/06/2023 1042   VLDL 17.0 04/06/2023 1042   LDLCALC 130 (H) 04/06/2023 1042   Hepatic Function Panel     Component Value Date/Time   PROT 7.1 04/06/2023 1042   ALBUMIN 3.7 04/06/2023 1042   AST 17 04/06/2023 1042   ALT 14 04/06/2023 1042   ALKPHOS 79 04/06/2023 1042   BILITOT 0.4 04/06/2023 1042      Component Value Date/Time   TSH 2.33 04/06/2023 1042   Nutritional Lab Results  Component Value Date   VD25OH 19.8 (L) 04/27/2023    Medications: Outpatient Encounter Medications as of 06/08/2023  Medication Sig   cloNIDine (CATAPRES) 0.2 MG tablet Take 0.2 mg by mouth at bedtime.   Dapsone 5 % topical gel Apply topically 2 (two) times daily.   Evening Primrose Oil 1000 MG CAPS Take 1 capsule by mouth daily.   Ferrous Sulfate (IRON) 325 (65 Fe) MG TABS Take by mouth.   FLUoxetine (PROZAC) 10 MG capsule Take 10 mg by mouth daily.   fluticasone (FLONASE) 50 MCG/ACT nasal spray USE 1 SPRAY IN EACH NOSTRIL DAILY   Magnesium 100 MG CAPS Take by mouth.   Magnesium Glycinate ([employer] MAGNESIUM GLYCINATE) 100 MG CAPS Take 400 mg by mouth as needed.   Multiple Vitamin (MULTIVITAMIN) capsule Take 1 capsule by mouth daily.   Norgestimate-Ethinyl Estradiol Triphasic 0.18/0.215/0.25 MG-35 MCG tablet Take 1 tablet by mouth daily. From dermatology for Acne    promethazine-dextromethorphan (PROMETHAZINE-DM) 6.25-15 MG/5ML syrup Take by mouth.   UNABLE TO FIND daily. Med Name: Nutri Calm Nervous System Support   vitamin C (ASCORBIC ACID) 500 MG tablet Take 1,000 mg by mouth daily.   Vitamin D, Ergocalciferol, (DRISDOL) 1.25 MG (50000 UNIT) CAPS capsule Take 1 capsule (50,000 Units total) by mouth every 7 (seven) days.   [DISCONTINUED] hydrOXYzine (ATARAX/VISTARIL) 10 MG tablet Take 1-2 tablets (10-20 mg total) by mouth at bedtime as needed.   [DISCONTINUED] metFORMIN (GLUCOPHAGE-XR) 500 MG 24 hr tablet Take 1 tablet (  500 mg total) by mouth daily with breakfast.   metFORMIN (GLUCOPHAGE-XR) 500 MG 24 hr tablet Take 1 tablet (500 mg total) by mouth 2 (two) times daily with a meal.   [DISCONTINUED] PARoxetine (PAXIL) 20 MG tablet TAKE 1 TABLET DAILY (Patient not taking: Reported on 06/08/2023)   [DISCONTINUED] traZODone (DESYREL) 50 MG tablet Take 0.5-1 tablets (25-50 mg total) by mouth at bedtime as needed for sleep. (Patient not taking: Reported on 06/08/2023)   No facility-administered encounter medications on file as of 06/08/2023.     Follow-Up   Return in about 3 weeks (around 06/29/2023) for For Weight Mangement with Dr. Rikki Spearing.Marland Kitchen She was informed of the importance of frequent follow up visits to maximize her success with intensive lifestyle modifications for her multiple health conditions.  Attestation Statement   Reviewed by clinician on day of visit: allergies, medications, problem list, medical history, surgical history, family history, social history, and previous encounter notes.     Worthy Rancher, MD

## 2023-06-08 NOTE — Assessment & Plan Note (Signed)
 Patient had been prescribed clonidine 0.2 mg at night by mental health care provider she is noticing some queasiness and lightheadedness.  Her blood pressure this morning is in the 80s.  She will reach out to her prescribing physician today so they can adjust her clonidine.  She will continue to be monitored for orthostatic hypotension.  Patient advised on fall prevention and also maintaining adequate hydration.

## 2023-06-08 NOTE — Assessment & Plan Note (Signed)
 Her Paxil has been changed to fluoxetine which should help with her weight loss.  Continue to follow-up with psychiatry for medication adjustments

## 2023-06-08 NOTE — Assessment & Plan Note (Signed)
 Most recent A1c is  Lab Results  Component Value Date   HGBA1C 6.0 (H) 04/27/2023   HGBA1C 5.9 01/08/2021    Patient aware of disease state and risk of progression. This may contribute to abnormal cravings, fatigue and diabetic complications without having diabetes.   We have discussed treatment options which include: losing 7 to 10% of body weight, increasing physical activity to a goal of 150 minutes a week at moderate intensity.  Advised to maintain a diet low on simple and processed carbohydrates.  We will increase metformin XR to 500 mg 1 tablet twice a day

## 2023-06-19 ENCOUNTER — Encounter (INDEPENDENT_AMBULATORY_CARE_PROVIDER_SITE_OTHER): Payer: Self-pay | Admitting: Internal Medicine

## 2023-06-19 ENCOUNTER — Other Ambulatory Visit (INDEPENDENT_AMBULATORY_CARE_PROVIDER_SITE_OTHER): Payer: Self-pay | Admitting: Internal Medicine

## 2023-06-19 DIAGNOSIS — R7303 Prediabetes: Secondary | ICD-10-CM

## 2023-06-19 MED ORDER — METFORMIN HCL ER 500 MG PO TB24: 500.0000 mg | ORAL_TABLET | Freq: Two times a day (BID) | ORAL | 0 refills | Status: DC

## 2023-06-19 NOTE — Progress Notes (Signed)
 Patient lost prescription requiring a supply Rx sent to pharmacy she will have to pay for medication out-of-pocket

## 2023-06-27 ENCOUNTER — Ambulatory Visit: Admitting: Neurology

## 2023-06-27 DIAGNOSIS — Z9189 Other specified personal risk factors, not elsewhere classified: Secondary | ICD-10-CM

## 2023-06-27 DIAGNOSIS — R0683 Snoring: Secondary | ICD-10-CM

## 2023-06-27 DIAGNOSIS — Z820 Family history of epilepsy and other diseases of the nervous system: Secondary | ICD-10-CM

## 2023-06-27 DIAGNOSIS — G4763 Sleep related bruxism: Secondary | ICD-10-CM

## 2023-06-27 DIAGNOSIS — G4733 Obstructive sleep apnea (adult) (pediatric): Secondary | ICD-10-CM

## 2023-06-27 DIAGNOSIS — R351 Nocturia: Secondary | ICD-10-CM

## 2023-06-27 DIAGNOSIS — G4719 Other hypersomnia: Secondary | ICD-10-CM

## 2023-06-28 NOTE — Progress Notes (Signed)
 See procedure note.

## 2023-06-28 NOTE — Addendum Note (Signed)
 Addended by: Huston Foley on: 06/28/2023 05:43 PM   Modules accepted: Orders

## 2023-06-28 NOTE — Procedures (Signed)
   [facility]  HOME SLEEP TEST (Watch PAT) REPORT  STUDY DATE: 06/27/2023  DOB: 2000-11-10  MRN: [id number]  ORDERING CLINICIAN: Huston Foley, MD, PhD   REFERRING CLINICIAN: Dr. Worthy Rancher  CLINICAL INFORMATION/HISTORY: 23 year old female with an underlying medical history of autism spectrum disorder, OCD, ADHD, insulin resistance, vitamin D deficiency, prediabetes, hyperlipidemia, and severe obesity with a BMI of over 40, who reports snoring and excessive daytime somnolence.   Epworth sleepiness score: 5/24.  BMI: 42.9 kg/m  FINDINGS:   Sleep Summary:   Total Recording Time (hours, min): 7 hours, 25 min  Total Sleep Time (hours, min):  6 hours, 46 min  Percent REM (%):    24.5%   Respiratory Indices:   Calculated pAHI (per hour):  10.5/hour         REM pAHI:    16.3/hour       NREM pAHI: 8.9/hour  Central pAHI: 0.8/hour  Oxygen Saturation Statistics:    Oxygen Saturation (%) Mean: 96%   Minimum oxygen saturation (%):                 84%   O2 Saturation Range (%): 84 - 99%    O2 Saturation (minutes) <=88%: 0 min  Pulse Rate Statistics:   Pulse Mean (bpm):    70/min    Pulse Range (56 - 97/min)   IMPRESSION: OSA (obstructive sleep apnea), mild    RECOMMENDATION:  This home sleep test demonstrates overall mild obstructive sleep apnea with a total AHI of 10.5/hour and O2 nadir of 84%. Snoring was detected, intermittently, ranging from mild to louder. Given the patient's medical history and sleep related complaints, therapy with a  positive airway pressure device is a reasonable first-line choice and clinically recommended. Treatment can be achieved in the form of autoPAP trial/titration at home for now. A full night, in-lab PAP titration study may aid in improving proper treatment settings and with mask fit, if needed, down the road. Alternative treatments may include weight loss (where appropriate) along with avoidance of the supine  sleep position (if possible), or an oral appliance in appropriate candidates.   Please note that untreated obstructive sleep apnea may carry additional perioperative morbidity. Patients with significant obstructive sleep apnea should receive perioperative PAP therapy and the surgeons and particularly the anesthesiologist should be informed of the diagnosis and the severity of the sleep disordered breathing. The patient should be cautioned not to drive, work at heights, or operate dangerous or heavy equipment when tired or sleepy. Review and reiteration of good sleep hygiene measures should be pursued with any patient. Other causes of the patient's symptoms, including circadian rhythm disturbances, an underlying mood disorder, medication effect and/or an underlying medical problem cannot be ruled out based on this test. Clinical correlation is recommended.  The patient and her referring provider will be notified of the test results. The patient will be seen in follow up in sleep clinic at [facility], as necessary.  I certify that I have reviewed the raw data recording prior to the issuance of this report in accordance with the standards of the American Academy of Sleep Medicine (AASM).  INTERPRETING PHYSICIAN:   Huston Foley, MD, PhD Medical Director, [facility]) Diplomat, ABPN (Neurology and Sleep)   [facility] [address] [place], [place] [address] [phone]

## 2023-07-04 ENCOUNTER — Telehealth: Payer: Self-pay | Admitting: *Deleted

## 2023-07-04 NOTE — Telephone Encounter (Signed)
 I spoke with the patient's mother Jacki Cones (on [place]) and discussed her sleep study results. The patient's mother agreed to setup on autopap. We discussed the insurance requirements which includes using the machine at least 4 hours at night and also seeing [place] for follow-up appt between 30 and 90 days after setup. I scheduled the pt for 6/5 at 3:45 pm for video visit. Her questions were answered. We discussed DME. Refer to [facility].    Referral sent to [facility]. Report sent to referring provider.

## 2023-07-04 NOTE — Telephone Encounter (Signed)
-----   Message from Huston Foley sent at 06/28/2023  5:43 PM EDT ----- Patient referred by Dr. Rikki Spearing, seen by me on 06/06/2023, patient had HST on 06/27/2023.    Please call and notify the patient that the recent home sleep test showed obstructive sleep apnea. OSA is overall mild, but worth treating to see if she feels better after treatment. To that end I recommend treatment for this in the form of autoPAP, which means, that we don't have to bring her in for a sleep study with CPAP, but will let her try an autoPAP machine at home, through a DME company (of her choice, or as per insurance requirement). The DME representative will educate her on how to use the machine, how to put the mask on, etc. I have placed an order in the chart. Please send referral, talk to patient, send report to referring MD. We will need a FU in sleep clinic in about 2.-3 months post-PAP set up (which is usually an insurance-mandated appointment to monitor compliance), please arrange that with me or one of our NPs. Please also go over the need for compliance with treatment (including the insurance-imposed minimum compliance percentage). Thanks,   Huston Foley, MD, PhD [facility])

## 2023-07-05 NOTE — Telephone Encounter (Signed)
 Zott, Linnell Fulling, Otilio Jefferson, RN; [place], [place] Got It Thank  You

## 2023-07-24 ENCOUNTER — Encounter (INDEPENDENT_AMBULATORY_CARE_PROVIDER_SITE_OTHER): Payer: Self-pay | Admitting: Internal Medicine

## 2023-07-24 ENCOUNTER — Ambulatory Visit (INDEPENDENT_AMBULATORY_CARE_PROVIDER_SITE_OTHER): Admitting: Internal Medicine

## 2023-07-24 VITALS — BP 119/82 | HR 75 | Temp 98.4°F | Ht 64.0 in | Wt 245.0 lb

## 2023-07-24 DIAGNOSIS — E7800 Pure hypercholesterolemia, unspecified: Secondary | ICD-10-CM | POA: Diagnosis not present

## 2023-07-24 DIAGNOSIS — T50905A Adverse effect of unspecified drugs, medicaments and biological substances, initial encounter: Secondary | ICD-10-CM

## 2023-07-24 DIAGNOSIS — G4733 Obstructive sleep apnea (adult) (pediatric): Secondary | ICD-10-CM | POA: Diagnosis not present

## 2023-07-24 DIAGNOSIS — R635 Abnormal weight gain: Secondary | ICD-10-CM

## 2023-07-24 DIAGNOSIS — E88819 Insulin resistance, unspecified: Secondary | ICD-10-CM | POA: Diagnosis not present

## 2023-07-24 DIAGNOSIS — Z6841 Body Mass Index (BMI) 40.0 and over, adult: Secondary | ICD-10-CM

## 2023-07-24 DIAGNOSIS — R7303 Prediabetes: Secondary | ICD-10-CM

## 2023-07-24 DIAGNOSIS — E66813 Obesity, class 3: Secondary | ICD-10-CM

## 2023-07-24 DIAGNOSIS — T43225D Adverse effect of selective serotonin reuptake inhibitors, subsequent encounter: Secondary | ICD-10-CM

## 2023-07-24 MED ORDER — METFORMIN HCL ER 500 MG PO TB24: 500.0000 mg | ORAL_TABLET | Freq: Every day | ORAL | 0 refills | Status: DC

## 2023-07-24 NOTE — Assessment & Plan Note (Signed)
 Mandy Whitehead continues to lose weight at a slow rate she has limited food variability and inadequate protein intake.  She does not like meats.  We counseled on other sources of protein and discussed the use of protein shakes to supplement some of her meals.  She also has low volumes of physical activity and does not have the motivation to start exercising.  We discussed about partnering with the fitness instructor as she has access to a private gym in her living complex.  She will think about this.  We discussed that doing some is better than none.

## 2023-07-24 NOTE — Assessment & Plan Note (Signed)
 Reviewed most recent sleep study her OSA is mild.  PAP therapy was recommended due to oxygen desaturations.  She is in the process of starting treatment.  Losing 15 % of body weight may improve condition.

## 2023-07-24 NOTE — Assessment & Plan Note (Signed)
 Her Paxil has been changed to fluoxetine which should help with her weight loss.  Continue to follow-up with psychiatry for medication adjustments

## 2023-07-24 NOTE — Assessment & Plan Note (Signed)
 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 Optimal level < 1.9.   This is complex condition associated with genetics, ectopic fat and lifestyle factors. Insulin  resistance may result in increased fat storage, inhibition of the breakdown of fat, cause fluctuations in blood sugar leading to energy crashes and increased cravings for sugary or high carb foods and cause metabolic slowdown making it difficult to lose weight.  This may result in additional weight gain and lead to pre-diabetes and diabetes if untreated. In addition, hyperinsulinemia increases cardiovascular risk, chronic inflammatory response and may increase the risk of obesity related malignancies.  Continue metformin  XR 500 mg once a day for pharmacoprophylaxis

## 2023-07-24 NOTE — Progress Notes (Signed)
 Office: [phone]  /  Fax: [phone]  Weight Summary And Biometrics  Vitals Temp: 98.4 F (36.9 C) BP: 119/82 Pulse Rate: 75 SpO2: 96 %   Anthropometric Measurements Height: 5\' 4"  (1.626 m) Weight: 245 lb (111.1 kg) BMI (Calculated): 42.03 Weight at Last Visit: 249 lb Weight Lost Since Last Visit: 4 lb Weight Gained Since Last Visit: 0 Starting Weight: 254 lb Total Weight Loss (lbs): 9 lb (4.082 kg) Peak Weight: 260 lb   Body Composition  Body Fat %: 47.6 % Fat Mass (lbs): 116.8 lbs Muscle Mass (lbs): 122.2 lbs Total Body Water (lbs): 86.4 lbs Visceral Fat Rating : 12    RMR: 1901  Today's Visit #: 4  Starting Date: 03/31/23   Subjective   Chief Complaint: Obesity  Mandy Whitehead is here to discuss her progress with her obesity treatment plan. She is on the the Category 3 Plan and states she is following her eating plan approximately 50-60% of the time. She states she is not exercising  Weight Progress Since Last Visit:  Since last office visit she has lost 4 pounds. She reports fair adherence to reduced calorie nutritional plan. She has been working on increasing protein intake at every meal, drinking more water, making healthier choices, eating out less, reducing portion sizes, incorporating more whole foods, is skipping meals, not exercising, and reports high levels of stress   She is no longer on Paxil  and is now on fluoxetine which is somewhat anorexogenic.  As a result she has noticed significant appetite suppression also with the increasing of metformin  to twice daily.  Medication will be reduced to once a day  Challenges affecting patient progress: limited food variation or intolerances and low volume of physical activity at present .   Orexigenic Control: Reports improved problems with appetite and hunger signals.  Reports improved problems with satiety and satiation.  Denies problems with eating patterns and portion control.  Denies abnormal  cravings. Denies feeling deprived or restricted.   Pharmacotherapy for weight management: She is currently taking Metformin  (off label use for incretin effect and / or insulin  resistance and / or diabetes prevention) with adequate clinical response  and without side effects..   Assessment and Plan   Treatment Plan For Obesity:  Recommended Dietary Goals  Mandy Whitehead is currently in the action stage of change. As such, her goal is to continue weight management plan. She has agreed to: continue current plan, try to incorporate protein smoothies or protein shake to increase protein intake.  Behavioral Health and Counseling  We discussed the following behavioral modification strategies today: increasing lean protein intake to established goals, increasing vegetables, increasing fiber rich foods, avoiding skipping meals, increasing water intake , and work on meal planning and preparation.  Additional education and resources provided today: None  Recommended Physical Activity Goals  Mandy Whitehead has been advised to work up to 150 minutes of moderate intensity aerobic activity a week and strengthening exercises 2-3 times per week for cardiovascular health, weight loss maintenance and preservation of muscle mass.   She has agreed to :  Think about enjoyable ways to increase daily physical activity and overcoming barriers to exercise and Increase physical activity in their day and reduce sedentary time (increase NEAT).  Consider hiring fitness instructor to start exercising at apartment gym  Pharmacotherapy  We discussed various medication options to help Mandy Whitehead with her weight loss efforts and we both agreed to :  Because of significant appetite suppression we will reduce metformin  to once a  day  Associated Conditions Impacted by Obesity Treatment  Insulin  resistance Assessment & Plan: Her HOMA-IR is 8.06 which is elevated. Optimal level < 1.9.   This is complex condition associated with genetics,  ectopic fat and lifestyle factors. Insulin  resistance may result in increased fat storage, inhibition of the breakdown of fat, cause fluctuations in blood sugar leading to energy crashes and increased cravings for sugary or high carb foods and cause metabolic slowdown making it difficult to lose weight.  This may result in additional weight gain and lead to pre-diabetes and diabetes if untreated. In addition, hyperinsulinemia increases cardiovascular risk, chronic inflammatory response and may increase the risk of obesity related malignancies.  Continue metformin  XR 500 mg once a day for pharmacoprophylaxis    Prediabetes Assessment & Plan: Patient has an A1c of 6.0 with hyperinsulinemia.  She is now on metformin  XR 500 mg twice a day but is experiencing significant appetite suppression since starting fluoxetine therefore medication will be reduced to once a day.  She will be due for disease monitoring labs in June or July.  Patient is eating out less and has also reduced simple sugars in her diet.  Continue with current weight management strategy   Orders: -     metFORMIN  HCl ER; Take 1 tablet (500 mg total) by mouth daily with breakfast.  Dispense: 90 tablet; Refill: 0  Pure hypercholesterolemia Assessment & Plan: Most recent LDL was 130.  This may be secondary to genetics, nutritional or spillover effect from excess adiposity.  She was counseled on maintaining a diet low in saturated fats at least less than 10% of calories.  Losing 10% of body weight may improve condition.  Check fasting lipid profile in July   Class 3 severe obesity with serious comorbidity and body mass index (BMI) of 40.0 to 44.9 in adult, unspecified obesity type [facility]) Assessment & Plan: Mandy Whitehead continues to lose weight at a slow rate she has limited food variability and inadequate protein intake.  She does not like meats.  We counseled on other sources of protein and discussed the use of protein shakes to supplement some  of her meals.  She also has low volumes of physical activity and does not have the motivation to start exercising.  We discussed about partnering with the fitness instructor as she has access to a private gym in her living complex.  She will think about this.  We discussed that doing some is better than none.   Weight gain due to medication Assessment & Plan: Her Paxil  has been changed to fluoxetine which should help with her weight loss.  Continue to follow-up with psychiatry for medication adjustments   OSA (obstructive sleep apnea) -mild-PS MG 2025 Assessment & Plan: Reviewed most recent sleep study her OSA is mild.  PAP therapy was recommended due to oxygen desaturations.  She is in the process of starting treatment.  Losing 15 % of body weight may improve condition.      Objective   Physical Exam:  Blood pressure 119/82, pulse 75, temperature 98.4 F (36.9 C), height 5\' 4"  (1.626 m), weight 245 lb (111.1 kg), last menstrual period 07/20/2023, SpO2 96%. Body mass index is 42.05 kg/m.  General: She is overweight, cooperative, alert, well developed, and in no acute distress. PSYCH: Has normal mood, affect and thought process.   HEENT: EOMI, sclerae are anicteric. Lungs: Normal breathing effort, no conversational dyspnea. Extremities: No edema.  Neurologic: No gross sensory or motor deficits. No tremors or fasciculations noted.  Diagnostic Data Reviewed:  BMET    Component Value Date/Time   NA 135 04/06/2023 1042   K 3.6 04/06/2023 1042   CL 104 04/06/2023 1042   CO2 24 04/06/2023 1042   GLUCOSE 99 04/06/2023 1042   BUN 7 04/06/2023 1042   CREATININE 0.72 04/06/2023 1042   CALCIUM 9.0 04/06/2023 1042   Lab Results  Component Value Date   HGBA1C 6.0 (H) 04/27/2023   HGBA1C 5.9 01/08/2021   Lab Results  Component Value Date   INSULIN  33.4 (H) 04/27/2023   Lab Results  Component Value Date   TSH 2.33 04/06/2023   CBC    Component Value Date/Time   WBC 8.2  04/06/2023 1042   RBC 4.40 04/06/2023 1042   HGB 11.7 (L) 04/06/2023 1042   HCT 36.1 04/06/2023 1042   PLT 297.0 04/06/2023 1042   MCV 81.9 04/06/2023 1042   MCHC 32.4 04/06/2023 1042   RDW 14.7 04/06/2023 1042   Iron Studies    Component Value Date/Time   IRON 92 04/06/2023 1042   TIBC 367 04/06/2023 1042   FERRITIN 24 04/06/2023 1042   IRONPCTSAT 25 04/06/2023 1042   Lipid Panel     Component Value Date/Time   CHOL 199 04/06/2023 1042   TRIG 85.0 04/06/2023 1042   HDL 52.10 04/06/2023 1042   CHOLHDL 4 04/06/2023 1042   VLDL 17.0 04/06/2023 1042   LDLCALC 130 (H) 04/06/2023 1042   Hepatic Function Panel     Component Value Date/Time   PROT 7.1 04/06/2023 1042   ALBUMIN 3.7 04/06/2023 1042   AST 17 04/06/2023 1042   ALT 14 04/06/2023 1042   ALKPHOS 79 04/06/2023 1042   BILITOT 0.4 04/06/2023 1042      Component Value Date/Time   TSH 2.33 04/06/2023 1042   Nutritional Lab Results  Component Value Date   VD25OH 19.8 (L) 04/27/2023    Medications: Outpatient Encounter Medications as of 07/24/2023  Medication Sig   cloNIDine (CATAPRES) 0.2 MG tablet Take 0.2 mg by mouth at bedtime.   Dapsone 5 % topical gel Apply topically 2 (two) times daily.   Evening Primrose Oil 1000 MG CAPS Take 1 capsule by mouth daily.   Ferrous Sulfate (IRON) 325 (65 Fe) MG TABS Take by mouth.   FLUoxetine (PROZAC) 10 MG capsule Take 10 mg by mouth daily.   fluticasone  (FLONASE ) 50 MCG/ACT nasal spray USE 1 SPRAY IN EACH NOSTRIL DAILY   Magnesium 100 MG CAPS Take by mouth.   Magnesium Glycinate ([employer] MAGNESIUM GLYCINATE) 100 MG CAPS Take 400 mg by mouth as needed.   Multiple Vitamin (MULTIVITAMIN) capsule Take 1 capsule by mouth daily.   Norgestimate-Ethinyl Estradiol Triphasic 0.18/0.215/0.25 MG-35 MCG tablet Take 1 tablet by mouth daily. From dermatology for Acne   promethazine-dextromethorphan (PROMETHAZINE-DM) 6.25-15 MG/5ML syrup Take by mouth.   UNABLE TO FIND daily. Med Name:  Nutri Calm Nervous System Support   vitamin C (ASCORBIC ACID) 500 MG tablet Take 1,000 mg by mouth daily.   Vitamin D , Ergocalciferol , (DRISDOL ) 1.25 MG (50000 UNIT) CAPS capsule Take 1 capsule (50,000 Units total) by mouth every 7 (seven) days.   [DISCONTINUED] metFORMIN  (GLUCOPHAGE -XR) 500 MG 24 hr tablet Take 1 tablet (500 mg total) by mouth 2 (two) times daily with a meal.   metFORMIN  (GLUCOPHAGE -XR) 500 MG 24 hr tablet Take 1 tablet (500 mg total) by mouth daily with breakfast.   No facility-administered encounter medications on file as of 07/24/2023.     Follow-Up  No follow-ups on file.Aaron Aas She was informed of the importance of frequent follow up visits to maximize her success with intensive lifestyle modifications for her multiple health conditions.  Attestation Statement   Reviewed by clinician on day of visit: allergies, medications, problem list, medical history, surgical history, family history, social history, and previous encounter notes.     Ladd Picker, MD

## 2023-07-24 NOTE — Assessment & Plan Note (Signed)
 Most recent LDL was 130.  This may be secondary to genetics, nutritional or spillover effect from excess adiposity.  She was counseled on maintaining a diet low in saturated fats at least less than 10% of calories.  Losing 10% of body weight may improve condition.  Check fasting lipid profile in July

## 2023-07-24 NOTE — Assessment & Plan Note (Signed)
 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  She is now on metformin  XR 500 mg twice a day but is experiencing significant appetite suppression since starting fluoxetine therefore medication will be reduced to once a day.  She will be due for disease monitoring labs in June or July.  Patient is eating out less and has also reduced simple sugars in her diet.  Continue with current weight management strategy

## 2023-08-30 NOTE — Progress Notes (Unsigned)
 Mandy Whitehead

## 2023-08-31 ENCOUNTER — Telehealth: Admitting: Adult Health

## 2023-08-31 ENCOUNTER — Encounter: Payer: Self-pay | Admitting: Adult Health

## 2023-08-31 ENCOUNTER — Encounter: Payer: Self-pay | Admitting: Neurology

## 2023-08-31 DIAGNOSIS — G4733 Obstructive sleep apnea (adult) (pediatric): Secondary | ICD-10-CM

## 2023-09-14 ENCOUNTER — Ambulatory Visit (INDEPENDENT_AMBULATORY_CARE_PROVIDER_SITE_OTHER): Admitting: Internal Medicine

## 2023-11-21 ENCOUNTER — Encounter (INDEPENDENT_AMBULATORY_CARE_PROVIDER_SITE_OTHER): Payer: Self-pay | Admitting: Internal Medicine

## 2023-11-21 ENCOUNTER — Ambulatory Visit (INDEPENDENT_AMBULATORY_CARE_PROVIDER_SITE_OTHER): Admitting: Internal Medicine

## 2023-11-21 VITALS — BP 104/70 | HR 76 | Temp 98.8°F | Ht 64.0 in | Wt 239.0 lb

## 2023-11-21 DIAGNOSIS — R7303 Prediabetes: Secondary | ICD-10-CM | POA: Diagnosis not present

## 2023-11-21 DIAGNOSIS — E559 Vitamin D deficiency, unspecified: Secondary | ICD-10-CM | POA: Diagnosis not present

## 2023-11-21 DIAGNOSIS — E66813 Obesity, class 3: Secondary | ICD-10-CM

## 2023-11-21 DIAGNOSIS — R635 Abnormal weight gain: Secondary | ICD-10-CM

## 2023-11-21 DIAGNOSIS — E88819 Insulin resistance, unspecified: Secondary | ICD-10-CM

## 2023-11-21 DIAGNOSIS — Z6841 Body Mass Index (BMI) 40.0 and over, adult: Secondary | ICD-10-CM

## 2023-11-21 DIAGNOSIS — E7800 Pure hypercholesterolemia, unspecified: Secondary | ICD-10-CM | POA: Diagnosis not present

## 2023-11-21 DIAGNOSIS — G4733 Obstructive sleep apnea (adult) (pediatric): Secondary | ICD-10-CM

## 2023-11-21 MED ORDER — WEGOVY 0.25 MG/0.5ML ~~LOC~~ SOAJ: 0.2500 mg | SUBCUTANEOUS | 0 refills | Status: DC

## 2023-11-21 NOTE — Assessment & Plan Note (Signed)
 Check fasting insulin  levels today, hemoglobin A1c.

## 2023-11-21 NOTE — Assessment & Plan Note (Signed)
 Reviewed most recent sleep study her OSA is mild.  PAP therapy was recommended due to oxygen desaturations.  She is in the process of starting treatment.  Losing 15 % of body weight may improve condition.

## 2023-11-21 NOTE — Assessment & Plan Note (Signed)
 On high-dose vitamin D  supplementation will check levels today for adequacy and transition to over-the-counter supplementation if appropriate.

## 2023-11-21 NOTE — Assessment & Plan Note (Signed)
 Weight: decrease of 21.4 lb (8.2%) over 7 months, 2 weeks  Start: 04/03/2023 260 lb 6.4 oz (118.1 kg)  End: 11/21/2023 239 lb (108.4 kg)  Obesity with associated conditions including obstructive sleep apnea, prediabetes, and hypercholesterolemia. Weight loss of 21 pounds since last visit indicates progress. Challenges with dietary adherence and meal timing persist. Discussion on Wegovy  injections for weight management and improvement of associated conditions. Wegovy  suppresses appetite, reduces cravings, and aids in weight loss, potentially improving sleep apnea, prediabetes, and hypercholesterolemia. Long-term use is required for sustained benefits. Potential side effects include constipation, nausea, and stomach upset, especially with fried or greasy foods. Adequate fiber intake is recommended to prevent constipation. Insurance coverage for Wegovy  needs confirmation. - Order A1c, cholesterol, insulin  level, and vitamin D  level tests. -Did not tolerate metformin  medication will be discontinued - Prescribe Wegovy  pending insurance approval. - Provide handout on managing Wegovy  side effects. - Schedule follow-up in five weeks for medication adjustment.

## 2023-11-21 NOTE — Progress Notes (Signed)
 Office: [phone]  /  Fax: [phone]  Weight Summary and Body Composition Analysis (BIA)  Vitals Temp: 98.8 F (37.1 C) BP: 104/70 Pulse Rate: 76 SpO2: 98 %   Anthropometric Measurements Height: 5' 4 (1.626 m) Weight: 239 lb (108.4 kg) BMI (Calculated): 41 Weight at Last Visit: 245lb Weight Lost Since Last Visit: 6lb Weight Gained Since Last Visit: 0lb Starting Weight: 254lb Total Weight Loss (lbs): 15 lb (6.804 kg) Peak Weight: 260lb   Body Composition  Body Fat %: 48.7 % Fat Mass (lbs): 116.6 lbs Muscle Mass (lbs): 116.6 lbs Total Body Water (lbs): 87.8 lbs Visceral Fat Rating : 12    RMR: 1901  Today's Visit #: 5  Starting Date: 03/31/23   Subjective   Chief Complaint: Obesity  Interval History Discussed the use of AI scribe software for clinical note transcription with the patient, who gave verbal consent to proceed.  History of Present Illness Mandy Whitehead is a 23 year old female with sleep apnea, prediabetes, and hypercholesterolemia who presents for medical weight management.  She has lost 21 pounds since her last visit in April, with her current weight at 239 pounds, down from 260 pounds. She attributes her weight gain to dietary habits, particularly the consumption of carbohydrates like pasta and rice, and notes difficulty adhering to her dietary plan.  She experiences gastrointestinal discomfort associated with metformin , which she has stopped taking due to stomach upset and pain, especially when consuming carbohydrates. She was previously on a reduced dose of one pill a day due to prior issues with the medication.  Her current medication regimen includes fluoxetine (Prozac) and clonidine at a dose of 0.2 mg, which she takes variably depending on her needs. She has discontinued the use of cough syrup.  Her eating pattern is irregular, often skipping breakfast and having her first meal around 3 or 4 PM, followed by a small meal at night. She  struggles with incorporating fruits and vegetables into her diet, although she has been consuming apples, bananas, and strawberry smoothies. She finds it challenging to eat greens like broccoli and spinach consistently.  She experiences issues with appetite control, often not feeling hungry and needing to be reminded to eat before noon. She also reports sleep disturbances, which she attributes to irregular medication scheduling.  She is currently fasting for blood work, which includes tests for A1c, insulin  levels, cholesterol, and vitamin D . She has not consumed anything except water prior to the appointment.     Challenges affecting patient progress: having difficulty with meal prep and planning, having difficulty focusing on healthy eating, and low volume of physical activity at present .    Pharmacotherapy for weight management: She is currently taking Metformin  (off label use for weight management and / or insulin  resistance and / or diabetes prevention) with adequate clinical response  and experiencing the following side effects: nausea and medication has been discontinued.   Assessment and Plan   Treatment Plan For Obesity:  Recommended Dietary Goals  Mandy Whitehead is currently in the action stage of change. As such, her goal is to continue weight management plan. She has agreed to: continue current plan  Behavioral Health and Counseling  We discussed the following behavioral modification strategies today: continue to work on maintaining a reduced calorie state, getting the recommended amount of protein, incorporating whole foods, making healthy choices, staying well hydrated and practicing mindfulness when eating. and increase protein intake, fibrous foods (25 grams per day for women, 30 grams for men) and  water to improve satiety and decrease hunger signals. .  Additional education and resources provided today: None  Recommended Physical Activity Goals  Mandy Whitehead has been advised to work up  to 150 minutes of moderate intensity aerobic activity a week and strengthening exercises 2-3 times per week for cardiovascular health, weight loss maintenance and preservation of muscle mass.  She has agreed to :  Think about enjoyable ways to increase daily physical activity and overcoming barriers to exercise, Increase physical activity in their day and reduce sedentary time (increase NEAT)., Increase volume of physical activity to a goal of 240 minutes a week, and Combine aerobic and strengthening exercises for efficiency and improved cardiometabolic health.  Medical Interventions and Pharmacotherapy  We discussed various medication options to help Mandy Whitehead with her weight loss efforts and we both agreed to : Start anti-obesity medication.  In addition to reduced calorie nutrition plan (RCNP), behavioral strategies and physical activity, Mandy Whitehead would benefit from pharmacotherapy to assist with hunger signals, satiety and cravings. This will reduce obesity-related health risks by inducing weight loss, and help reduce food consumption and adherence to Mandy Whitehead) . It may also improve QOL by improving self-confidence and reduce the  setbacks associated with metabolic adaptations.  She has OSA, insulin  resistance, possible PCOS, prediabetes.  She had been on metformin  medication has been discontinued due to intolerance.  After discussion of treatment options, mechanisms of action, benefits, side effects, contraindications and shared decision making she is agreeable to starting Wegovy  0.25 mg once a week. Patient also made aware that medication is indicated for long-term management of obesity and the risk of weight regain following discontinuation of treatment and hence the importance of adhering to medical weight loss plan.  We demonstrated use of device and patient using teach back method was able to demonstrate proper technique.  Associated Conditions Impacted by Obesity Treatment  Assessment & Plan Insulin   resistance Check fasting insulin  levels today, hemoglobin A1c. Vitamin D  deficiency On high-dose vitamin D  supplementation will check levels today for adequacy and transition to over-the-counter supplementation if appropriate. Prediabetes Pure hypercholesterolemia Class 3 severe obesity with serious comorbidity and body mass index (BMI) of 40.0 to 44.9 in adult, unspecified obesity type Weight: decrease of 21.4 lb (8.2%) over 7 months, 2 weeks  Start: 04/03/2023 260 lb 6.4 oz (118.1 kg)  End: 11/21/2023 239 lb (108.4 kg)  Obesity with associated conditions including obstructive sleep apnea, prediabetes, and hypercholesterolemia. Weight loss of 21 pounds since last visit indicates progress. Challenges with dietary adherence and meal timing persist. Discussion on Wegovy  injections for weight management and improvement of associated conditions. Wegovy  suppresses appetite, reduces cravings, and aids in weight loss, potentially improving sleep apnea, prediabetes, and hypercholesterolemia. Long-term use is required for sustained benefits. Potential side effects include constipation, nausea, and stomach upset, especially with fried or greasy foods. Adequate fiber intake is recommended to prevent constipation. Insurance coverage for Wegovy  needs confirmation. - Order A1c, cholesterol, insulin  level, and vitamin D  level tests. -Did not tolerate metformin  medication will be discontinued - Prescribe Wegovy  pending insurance approval. - Provide handout on managing Wegovy  side effects. - Schedule follow-up in five weeks for medication adjustment. OSA (obstructive sleep apnea) Reviewed most recent sleep study her OSA is mild.  PAP therapy was recommended due to oxygen desaturations.  She is in the process of starting treatment.  Losing 15 % of body weight may improve condition.         Objective   Physical Exam:  Blood pressure 104/70, pulse  76, temperature 98.8 F (37.1 C), height 5' 4 (1.626 m),  weight 239 lb (108.4 kg), SpO2 98%. Body mass index is 41.02 kg/m.  General: She is overweight, cooperative, alert, well developed, and in no acute distress. PSYCH: Has normal mood, affect and thought process.   HEENT: EOMI, sclerae are anicteric. Lungs: Normal breathing effort, no conversational dyspnea. Extremities: No edema.  Neurologic: No gross sensory or motor deficits. No tremors or fasciculations noted.    Diagnostic Data Reviewed:  BMET    Component Value Date/Time   NA 135 04/06/2023 1042   K 3.6 04/06/2023 1042   CL 104 04/06/2023 1042   CO2 24 04/06/2023 1042   GLUCOSE 99 04/06/2023 1042   BUN 7 04/06/2023 1042   CREATININE 0.72 04/06/2023 1042   CALCIUM 9.0 04/06/2023 1042   Lab Results  Component Value Date   HGBA1C 6.0 (H) 04/27/2023   HGBA1C 5.9 01/08/2021   Lab Results  Component Value Date   INSULIN  33.4 (H) 04/27/2023   Lab Results  Component Value Date   TSH 2.33 04/06/2023   CBC    Component Value Date/Time   WBC 8.2 04/06/2023 1042   RBC 4.40 04/06/2023 1042   HGB 11.7 (L) 04/06/2023 1042   HCT 36.1 04/06/2023 1042   PLT 297.0 04/06/2023 1042   MCV 81.9 04/06/2023 1042   MCHC 32.4 04/06/2023 1042   RDW 14.7 04/06/2023 1042   Iron Studies    Component Value Date/Time   IRON 92 04/06/2023 1042   TIBC 367 04/06/2023 1042   FERRITIN 24 04/06/2023 1042   IRONPCTSAT 25 04/06/2023 1042   Lipid Panel     Component Value Date/Time   CHOL 199 04/06/2023 1042   TRIG 85.0 04/06/2023 1042   HDL 52.10 04/06/2023 1042   CHOLHDL 4 04/06/2023 1042   VLDL 17.0 04/06/2023 1042   LDLCALC 130 (H) 04/06/2023 1042   Hepatic Function Panel     Component Value Date/Time   PROT 7.1 04/06/2023 1042   ALBUMIN 3.7 04/06/2023 1042   AST 17 04/06/2023 1042   ALT 14 04/06/2023 1042   ALKPHOS 79 04/06/2023 1042   BILITOT 0.4 04/06/2023 1042      Component Value Date/Time   TSH 2.33 04/06/2023 1042   Nutritional Lab Results  Component Value Date    VD25OH 19.8 (L) 04/27/2023    Medications: Outpatient Encounter Medications as of 11/21/2023  Medication Sig   cloNIDine (CATAPRES) 0.2 MG tablet Take 0.2 mg by mouth at bedtime.   Dapsone 5 % topical gel Apply topically 2 (two) times daily.   Ferrous Sulfate (IRON) 325 (65 Fe) MG TABS Take by mouth.   FLUoxetine (PROZAC) 10 MG capsule Take 10 mg by mouth daily.   fluticasone  (FLONASE ) 50 MCG/ACT nasal spray USE 1 SPRAY IN EACH NOSTRIL DAILY   Magnesium 100 MG CAPS Take by mouth.   Magnesium Glycinate ([employer] MAGNESIUM GLYCINATE) 100 MG CAPS Take 400 mg by mouth as needed.   Multiple Vitamin (MULTIVITAMIN) capsule Take 1 capsule by mouth daily.   Norgestimate-Ethinyl Estradiol Triphasic 0.18/0.215/0.25 MG-35 MCG tablet Take 1 tablet by mouth daily. From dermatology for Acne   semaglutide -weight management (WEGOVY ) 0.25 MG/0.5ML SOAJ SQ injection Inject 0.25 mg into the skin once a week.   UNABLE TO FIND daily. Med Name: Nutri Calm Nervous System Support   vitamin C (ASCORBIC ACID) 500 MG tablet Take 1,000 mg by mouth daily.   Vitamin D , Ergocalciferol , (DRISDOL ) 1.25 MG (50000 UNIT) CAPS capsule  Take 1 capsule (50,000 Units total) by mouth every 7 (seven) days.   [DISCONTINUED] metFORMIN  (GLUCOPHAGE -XR) 500 MG 24 hr tablet Take 1 tablet (500 mg total) by mouth daily with breakfast.   [DISCONTINUED] Evening Primrose Oil 1000 MG CAPS Take 1 capsule by mouth daily. (Patient not taking: Reported on 11/21/2023)   [DISCONTINUED] promethazine-dextromethorphan (PROMETHAZINE-DM) 6.25-15 MG/5ML syrup Take by mouth. (Patient not taking: Reported on 11/21/2023)   No facility-administered encounter medications on file as of 11/21/2023.     Follow-Up   Return in about 4 weeks (around 12/19/2023) for For Weight Mangement with Dr. Francyne.SABRA She was informed of the importance of frequent follow up visits to maximize her success with intensive lifestyle modifications for her multiple health  conditions.  Attestation Statement   Reviewed by clinician on day of visit: allergies, medications, problem list, medical history, surgical history, family history, social history, and previous encounter notes.     Lucas Francyne, MD

## 2023-11-23 ENCOUNTER — Ambulatory Visit (INDEPENDENT_AMBULATORY_CARE_PROVIDER_SITE_OTHER): Payer: Self-pay | Admitting: Internal Medicine

## 2023-11-23 LAB — LIPID PANEL WITH LDL/HDL RATIO
Cholesterol, Total: 196 mg/dL (ref 100–199)
HDL: 63 mg/dL (ref >39)
LDL Chol Calc (NIH): 122 mg/dL — ABNORMAL HIGH (ref 0–99)
LDL/HDL Ratio: 1.9 ratio (ref 0.0–3.2)
Triglycerides: 62 mg/dL (ref 0–149)
VLDL Cholesterol Cal: 11 mg/dL (ref 5–40)

## 2023-11-23 LAB — VITAMIN D 25 HYDROXY (VIT D DEFICIENCY, FRACTURES): Vit D, 25-Hydroxy: 47.8 ng/mL (ref 30.0–100.0)

## 2023-11-23 LAB — HEMOGLOBIN A1C
Est. average glucose Bld gHb Est-mCnc: 131 mg/dL
Hgb A1c MFr Bld: 6.2 % — ABNORMAL HIGH (ref 4.8–5.6)

## 2023-11-23 LAB — INSULIN, RANDOM: INSULIN: 39.1 u[IU]/mL — AB (ref 2.6–24.9)

## 2023-11-30 ENCOUNTER — Telehealth (INDEPENDENT_AMBULATORY_CARE_PROVIDER_SITE_OTHER): Payer: Self-pay

## 2023-11-30 NOTE — Telephone Encounter (Signed)
 PA for Wegovy  0.25 mg has been denied. PA is now complete.     Your prior authorization request has been denied. Complete E-Appeal Your request for prior authorization was denied, but an Electronic Appeal is available for your patient. Complete the questions in the Appeal section at the bottom of this page to pursue the appeal. For assistance, contact our support team at [phone].  Message from plan: Request Reference Number: [id number]. WEGOVY  INJ 0.25MG  is denied due to Plan Exclusion. For further questions, call [phone].

## 2023-11-30 NOTE — Telephone Encounter (Signed)
 PA - Wegovy  - 0.25 mg started

## 2023-12-19 ENCOUNTER — Ambulatory Visit (INDEPENDENT_AMBULATORY_CARE_PROVIDER_SITE_OTHER): Admitting: Internal Medicine

## 2024-01-18 ENCOUNTER — Ambulatory Visit (INDEPENDENT_AMBULATORY_CARE_PROVIDER_SITE_OTHER): Admitting: Internal Medicine

## 2024-01-18 ENCOUNTER — Encounter (INDEPENDENT_AMBULATORY_CARE_PROVIDER_SITE_OTHER): Payer: Self-pay | Admitting: Internal Medicine

## 2024-01-18 VITALS — BP 102/68 | HR 79 | Temp 98.6°F | Ht 64.0 in | Wt 236.0 lb

## 2024-01-18 DIAGNOSIS — G4733 Obstructive sleep apnea (adult) (pediatric): Secondary | ICD-10-CM | POA: Diagnosis not present

## 2024-01-18 DIAGNOSIS — Z6841 Body Mass Index (BMI) 40.0 and over, adult: Secondary | ICD-10-CM | POA: Diagnosis not present

## 2024-01-18 DIAGNOSIS — E66813 Obesity, class 3: Secondary | ICD-10-CM | POA: Diagnosis not present

## 2024-01-18 DIAGNOSIS — E88819 Insulin resistance, unspecified: Secondary | ICD-10-CM

## 2024-01-18 DIAGNOSIS — R7303 Prediabetes: Secondary | ICD-10-CM

## 2024-01-18 MED ORDER — TIRZEPATIDE 2.5 MG/0.5ML ~~LOC~~ SOAJ: 2.5000 mg | SUBCUTANEOUS | 0 refills | Status: DC

## 2024-01-18 NOTE — Assessment & Plan Note (Signed)
 She had an A1c of 6.4 and after a decrease to 6.0 she is now back to 6.2.  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  She is certainly at risk for progression to diabetes.  She did not tolerate metformin  due to some GI side effects work on maintaining a diet low in simple and added sugars and will be working on increasing physical activity.  I feel that patient would benefit from GLP-1 treatment for diabetes prevention.

## 2024-01-18 NOTE — Assessment & Plan Note (Signed)
 Reviewed most recent sleep study her OSA is mild.  PAP therapy was recommended due to oxygen desaturations.  She has started treatment with still symptomatic.  She has lost 10% of total body weight but probably needs to lose more to improve her symptoms.  She will benefit from GLP-1 aided weight loss.

## 2024-01-18 NOTE — Assessment & Plan Note (Signed)
  Weight: decrease of 24.4 lb (9.4%) over 9 months, 2 weeks  Start: 04/03/2023 260 lb 6.4 oz (118.1 kg)  End: 01/18/2024 236 lb (107 kg)  Mandy Whitehead has lost 24.4 pounds, approximately 10% of her body weight via nutritional and behavioral strategies.  She manages her weight without medication, focusing on nutrition and support from her mother. Challenges include maintaining consistency with exercise and avoiding tempting foods at home.  She is also high risk for diabetes. - Encourage continued weight management through nutrition and support. - Advise on portion control and moderation, especially during special occasions. - Discuss the benefits of exercise for weight loss and mood improvement. - Encourage consistency in exercise routine. -Start Mounjaro 2.5 mg once a week

## 2024-01-18 NOTE — Progress Notes (Signed)
 Office: [phone]  /  Fax: [phone]  Weight Summary and Body Composition Analysis (BIA)  Vitals Temp: 98.6 F (37 C) BP: 102/68 Pulse Rate: 79 SpO2: 95 %   Anthropometric Measurements Height: 5' 4 (1.626 m) Weight: 236 lb (107 kg) BMI (Calculated): 40.49 Weight at Last Visit: 239 lb Weight Lost Since Last Visit: 3 lb Weight Gained Since Last Visit: 0 Starting Weight: 254 lb Total Weight Loss (lbs): 18 lb (8.165 kg) Peak Weight: 260 lb   Body Composition  Body Fat %: 47.3 % Fat Mass (lbs): 111.8 lbs Muscle Mass (lbs): 118.2 lbs Total Body Water (lbs): 84.2 lbs Visceral Fat Rating : 11    RMR: 1901  Today's Visit #: 6  Starting Date: 03/31/23   Subjective   Chief Complaint: Obesity  Interval History Discussed the use of AI scribe software for clinical note transcription with the patient, who gave verbal consent to proceed.  History of Present Illness Mandy Whitehead is a 23 year old female with obesity who presents for medical weight management.  Associated conditions include sleep apnea, prediabetes with high insulin  levels, hypercholesterolemia.  Since last office visit she has lost 3 pounds she is following a 1200-calorie treatment plan with fair adherence.  She has been experiencing difficulty in maintaining her weight, particularly following emotional distress due to a family emergency. Due to her father's unexpected hospitalization led to a depressive episode, during which she discontinued her medications and her eating habits deteriorated, resulting in weight gain.  Despite these challenges, she has lost 24.4 pounds, approximately 10% of her body weight. She is currently avoiding late-night eating and is working on resuming her routine of consuming nutritional shakes. She does not regularly keep tempting foods like cookies and ice cream at home, but a recent themed movie night led to an increase in such foods, making it difficult for her to resist  them.  She has mild sleep apnea and uses a CPAP machine, though she reports no significant improvement in sleep quality. Her family history includes diabetes on her father's and mother's side. She has a history of prediabetes with an A1c of 6.2 which has been increasing despite her weight loss. She previously tried metformin  but experienced adverse effects and is not currently on any weight management medications.  She struggles with maintaining a consistent exercise routine due to a lack of motivation and feeling unwell after workouts. No regular snacking or overeating, but she struggles with eating three times a day and choosing healthy options.     Challenges affecting patient progress: low volume of physical activity at present .    Pharmacotherapy for weight management: She is currently taking no anti-obesity medication and did not tolerate metformin  in the past.   Assessment and Plan   Treatment Plan For Obesity:  Recommended Dietary Goals  Mandy Whitehead is currently in the action stage of change. As such, her goal is to continue weight management plan. She has agreed to: continue current plan  Behavioral Health and Counseling  We discussed the following behavioral modification strategies today: increasing lean protein intake to established goals, decreasing simple carbohydrates , increasing vegetables, increasing fiber rich foods, increasing water intake , work on meal planning and preparation, practice mindfulness eating and understand the difference between hunger signals and cravings, avoiding temptations and identifying enticing environmental cues, avoid all or none thinking, and display self-compassion.  Additional education and resources provided today: None  Recommended Physical Activity Goals  Mandy Whitehead has been advised to work up to  150 minutes of moderate intensity aerobic activity a week and strengthening exercises 2-3 times per week for cardiovascular health, weight loss  maintenance and preservation of muscle mass.  She has agreed to :  Think about enjoyable ways to increase daily physical activity and overcoming barriers to exercise and Increase physical activity in their day and reduce sedentary time (increase NEAT).  Medical Interventions and Pharmacotherapy  We discussed various medication options to help Mandy Whitehead with her weight loss efforts and we both agreed to : Start anti-obesity medication.  In addition to a reduced-calorie nutrition plan (RCNP), behavioral strategies, and regular physical activity, Mandy Whitehead would benefit from the addition of pharmacotherapy to target the neurohormonal drivers of obesity--specifically dysregulation of hunger cues, impaired satiety, increased cravings, and reduced inhibitory control. Pharmacologic intervention, particularly with a GLP-1 receptor agonist, can support meaningful and sustained weight loss, decrease caloric intake, and enhance long-term adherence to lifestyle modifications. It also addresses the physiologic adaptations that commonly lead to weight regain, thereby improving treatment durability and quality of life (QoL).  She presents with a complex interplay of biomechanical, metabolic, and cardiovascular comorbidities that are directly linked to obesity, significantly elevating the risk of morbidity and mortality. These include: Hyperlipidemia, OSA, Prediabetes, and Insulin  Resistance, all of which are either improved by or potentially reversible through sustained weight loss.  She had a hemoglobin A1c of 6.4 in January 2025 and after reduction to 6.0 is now back to 6.2.  She has multiple family members that have diabetes.  Her hemoglobin A1c increased despite nutritional changes and weight loss of 10 % since starting program.  She has intolerance to metformin .  Previous interventions have included: Pharmacologic attempts: She has tried metformin  but experienced side effects Dietary approaches: 1200-calorie nutrition  plan which resulted in 10% total body weight loss Lifestyle interventions: Patient has also participated in structured exercise program although recently has experienced a lapse due to worsening depression.  Despite sustained effort, she has not achieved sufficient weight reduction or resolution of obesity-related conditions, prompting consideration of GLP-1 therapy as a medically necessary next step. GLP-1 receptor agonists have been shown in robust clinical trials to: Enhance satiety and delay gastric emptying, resulting in reduced caloric intake Improve insulin  sensitivity, lower fasting glucose, and reduce HbA1c Promote clinically meaningful weight loss (>=5-15%) Reduce cardiovascular risk markers, including blood pressure, lipids, and inflammation Given Brandin's clinical profile--including [insert relevant factors: obesity, insulin  resistance, prediabetes (with rising A1c of 6.2 and a prior A1c of 6.4 , OSA --GLP-1 therapy is both indicated and expected to provide multifactorial benefit.   After a detailed discussion covering treatment rationale, mechanism of action, expected outcomes, risks, and long-term use considerations, shared decision-making was used to initiate Mounjaro 2.5 mg once a week. The importance of ongoing lifestyle management and long-term adherence was emphasized, as was the potential for weight regain following discontinuation.  The delivery device was demonstrated, and using the teach-back method, Mandy Whitehead successfully demonstrated proper injection technique. Ongoing monitoring and follow-up are planned to assess tolerability, clinical response, and reinforce behavioral strategies.   Associated Conditions Impacted by Obesity Treatment  Assessment & Plan Prediabetes Insulin  resistance She had an A1c of 6.4 and after a decrease to 6.0 she is now back to 6.2.  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   She is certainly at risk for progression to diabetes.  She did not tolerate metformin  due to some GI side  effects work on maintaining a diet low in simple and added sugars and will be working on increasing physical activity.  I feel that patient would benefit from GLP-1 treatment for diabetes prevention. Class 3 severe obesity with serious comorbidity and body mass index (BMI) of 40.0 to 44.9 in adult, unspecified obesity type (HCC)  Weight: decrease of 24.4 lb (9.4%) over 9 months, 2 weeks  Start: 04/03/2023 260 lb 6.4 oz (118.1 kg)  End: 01/18/2024 236 lb (107 kg)  Mandy Whitehead has lost 24.4 pounds, approximately 10% of her body weight via nutritional and behavioral strategies.  She manages her weight without medication, focusing on nutrition and support from her mother. Challenges include maintaining consistency with exercise and avoiding tempting foods at home.  She is also high risk for diabetes. - Encourage continued weight management through nutrition and support. - Advise on portion control and moderation, especially during special occasions. - Discuss the benefits of exercise for weight loss and mood improvement. - Encourage consistency in exercise routine. -Start Mounjaro 2.5 mg once a week OSA (obstructive sleep apnea) Reviewed most recent sleep study her OSA is mild.  PAP therapy was recommended due to oxygen desaturations.  She has started treatment with still symptomatic.  She has lost 10% of total body weight but probably needs to lose more to improve her symptoms.  She will benefit from GLP-1 aided weight loss.          Objective   Physical Exam:  Blood pressure 102/68, pulse 79, temperature 98.6 F (37 C), height 5' 4 (1.626 m), weight 236 lb (107 kg), SpO2 95%. Body mass index is 40.51 kg/m.  General: She is overweight, cooperative, alert, well developed, and in no acute distress. PSYCH: Has normal mood, affect and thought process.   HEENT: EOMI, sclerae are  anicteric. Lungs: Normal breathing effort, no conversational dyspnea. Extremities: No edema.  Neurologic: No gross sensory or motor deficits. No tremors or fasciculations noted.    Diagnostic Data Reviewed:  BMET    Component Value Date/Time   NA 135 04/06/2023 1042   K 3.6 04/06/2023 1042   CL 104 04/06/2023 1042   CO2 24 04/06/2023 1042   GLUCOSE 99 04/06/2023 1042   BUN 7 04/06/2023 1042   CREATININE 0.72 04/06/2023 1042   CALCIUM 9.0 04/06/2023 1042   Lab Results  Component Value Date   HGBA1C 6.2 (H) 11/21/2023   HGBA1C 5.9 01/08/2021   Lab Results  Component Value Date   INSULIN  39.1 (H) 11/21/2023   INSULIN  33.4 (H) 04/27/2023   Lab Results  Component Value Date   TSH 2.33 04/06/2023   CBC    Component Value Date/Time   WBC 8.2 04/06/2023 1042   RBC 4.40 04/06/2023 1042   HGB 11.7 (L) 04/06/2023 1042   HCT 36.1 04/06/2023 1042   PLT 297.0 04/06/2023 1042   MCV 81.9 04/06/2023 1042   MCHC 32.4 04/06/2023 1042   RDW 14.7 04/06/2023 1042   Iron Studies    Component Value Date/Time   IRON 92 04/06/2023 1042   TIBC 367 04/06/2023 1042   FERRITIN 24 04/06/2023 1042   IRONPCTSAT 25 04/06/2023 1042   Lipid Panel     Component Value Date/Time   CHOL 196 11/21/2023 1111   TRIG 62 11/21/2023 1111   HDL 63 11/21/2023 1111   CHOLHDL 4 04/06/2023 1042   VLDL 17.0 04/06/2023 1042   LDLCALC 122 (H) 11/21/2023 1111   Hepatic Function Panel     Component Value Date/Time  PROT 7.1 04/06/2023 1042   ALBUMIN 3.7 04/06/2023 1042   AST 17 04/06/2023 1042   ALT 14 04/06/2023 1042   ALKPHOS 79 04/06/2023 1042   BILITOT 0.4 04/06/2023 1042      Component Value Date/Time   TSH 2.33 04/06/2023 1042   Nutritional Lab Results  Component Value Date   VD25OH 47.8 11/21/2023   VD25OH 19.8 (L) 04/27/2023    Medications: Outpatient Encounter Medications as of 01/18/2024  Medication Sig   cloNIDine (CATAPRES) 0.2 MG tablet Take 0.2 mg by mouth at bedtime.    Dapsone 5 % topical gel Apply topically 2 (two) times daily.   Ferrous Sulfate (IRON) 325 (65 Fe) MG TABS Take by mouth.   FLUoxetine (PROZAC) 10 MG capsule Take 10 mg by mouth daily.   fluticasone  (FLONASE ) 50 MCG/ACT nasal spray USE 1 SPRAY IN EACH NOSTRIL DAILY   Magnesium 100 MG CAPS Take by mouth.   Magnesium Glycinate ([employer] MAGNESIUM GLYCINATE) 100 MG CAPS Take 400 mg by mouth as needed.   Multiple Vitamin (MULTIVITAMIN) capsule Take 1 capsule by mouth daily.   Norgestimate-Ethinyl Estradiol Triphasic 0.18/0.215/0.25 MG-35 MCG tablet Take 1 tablet by mouth daily. From dermatology for Acne   tirzepatide [facility]) 2.5 MG/0.5ML Pen Inject 2.5 mg into the skin once a week.   UNABLE TO FIND daily. Med Name: Nutri Calm Nervous System Support   vitamin C (ASCORBIC ACID) 500 MG tablet Take 1,000 mg by mouth daily.   Vitamin D , Ergocalciferol , (DRISDOL ) 1.25 MG (50000 UNIT) CAPS capsule Take 1 capsule (50,000 Units total) by mouth every 7 (seven) days.   [DISCONTINUED] semaglutide -weight management (WEGOVY ) 0.25 MG/0.5ML SOAJ SQ injection Inject 0.25 mg into the skin once a week.   No facility-administered encounter medications on file as of 01/18/2024.     Follow-Up   Return in about 4 weeks (around 02/15/2024) for For Weight Mangement with Dr. Francyne.SABRA She was informed of the importance of frequent follow up visits to maximize her success with intensive lifestyle modifications for her multiple health conditions.  Attestation Statement   Reviewed by clinician on day of visit: allergies, medications, problem list, medical history, surgical history, family history, social history, and previous encounter notes.   I have spent 37 minutes in the care of the patient today including: 2 minutes before the visit reviewing and preparing the chart. 25 minutes face-to-face assessing and reviewing listed medical problems as outlined in obesity care plan, providing nutritional and behavioral counseling  on topics outlined in the obesity care plan, counseling regarding anti-obesity medication as outlined in obesity care plan, independently interpreting test results and goals of care, as described in assessment and plan, reviewing and discussing biometric information and progress, and ordering medications - see orders 10 minutes after the visit updating chart and documentation of encounter.    Lucas Francyne, MD

## 2024-02-29 ENCOUNTER — Ambulatory Visit (INDEPENDENT_AMBULATORY_CARE_PROVIDER_SITE_OTHER): Payer: Self-pay | Admitting: Internal Medicine

## 2024-03-26 ENCOUNTER — Ambulatory Visit (INDEPENDENT_AMBULATORY_CARE_PROVIDER_SITE_OTHER): Admitting: Internal Medicine

## 2024-03-26 ENCOUNTER — Encounter (INDEPENDENT_AMBULATORY_CARE_PROVIDER_SITE_OTHER): Payer: Self-pay | Admitting: Internal Medicine

## 2024-03-26 VITALS — BP 109/74 | HR 76 | Temp 98.4°F | Ht 64.0 in | Wt 236.0 lb

## 2024-03-26 DIAGNOSIS — G4733 Obstructive sleep apnea (adult) (pediatric): Secondary | ICD-10-CM

## 2024-03-26 DIAGNOSIS — E7800 Pure hypercholesterolemia, unspecified: Secondary | ICD-10-CM | POA: Diagnosis not present

## 2024-03-26 DIAGNOSIS — E66813 Obesity, class 3: Secondary | ICD-10-CM | POA: Diagnosis not present

## 2024-03-26 DIAGNOSIS — R7303 Prediabetes: Secondary | ICD-10-CM

## 2024-03-26 DIAGNOSIS — Z6841 Body Mass Index (BMI) 40.0 and over, adult: Secondary | ICD-10-CM | POA: Diagnosis not present

## 2024-03-26 DIAGNOSIS — E88819 Insulin resistance, unspecified: Secondary | ICD-10-CM

## 2024-03-26 MED ORDER — PHENTERMINE HCL 8 MG PO TABS: 8.0000 mg | ORAL_TABLET | Freq: Every day | ORAL | 0 refills | Status: AC

## 2024-03-26 NOTE — Progress Notes (Signed)
 "  Office: [phone]  /  Fax: [phone]  Weight Summary and Body Composition Analysis (BIA)  Vitals Temp: 98.4 F (36.9 C) BP: 109/74 Pulse Rate: 76 SpO2: 99 %   Anthropometric Measurements Height: 5' 4 (1.626 m) Weight: 236 lb (107 kg) BMI (Calculated): 40.49 Weight at Last Visit: 236 lb Weight Lost Since Last Visit: 0 Weight Gained Since Last Visit: 0 Starting Weight: 254 lb Total Weight Loss (lbs): 18 lb (8.165 kg) Peak Weight: 260 lb   Body Composition  Body Fat %: 47 % Fat Mass (lbs): 111 lbs Muscle Mass (lbs): 125 lbs Total Body Water (lbs): 85.6 lbs Visceral Fat Rating : 11    RMR: 1901  Today's Visit #: 7  Starting Date: 03/31/23   Subjective   Chief Complaint: Obesity  Interval History  Discussed the use of AI scribe software for clinical note transcription with the patient, who gave verbal consent to proceed.  History of Present Illness Mandy Whitehead is a 23 year old female who presents for a weight management consultation. She is accompanied by her mother.  Since last office visit she has maintained she has lost a total of 18 pounds since starting her program she is on a 1200-calorie nutrition plan with low adherence.  She has been actively managing her diet by avoiding heavy meals at night and has maintained her weight loss over the holidays by consuming lighter meals such as rice and collard greens, while avoiding high-calorie foods like ribs and ham. She uses protein shakes and applesauce to limit carbohydrate intake after a certain time.  Her insurance denied coverage for Wegovy  and Mounjaro . She previously had blood work done in August, during which her A1c levels were checked. She did not tolerate metformin  well in the past.  She experiences occasional difficulty sleeping and is currently taking clonidine for this issue. She has not been exercising recently and is unsure why her physical activity has decreased.  She has not taken  phentermine before but is interested in trying it as an appetite suppressant to aid in weight management and potentially increase her energy levels. No history of stimulant or cocaine use.     Challenges affecting patient progress: having difficulty focusing on healthy eating, difficulty implementing reduced calorie nutrition plan, difficulty maintaining a reduced calorie state, exposure to enticing environments and/or relationships, and low volume of physical activity at present .    Pharmacotherapy for weight management: She is currently taking no anti-obesity medication and does not have coverage for GLP-1.   Assessment and Plan   Treatment Plan For Obesity:  Recommended Dietary Goals  Mandy Whitehead is currently in the action stage of change. As such, her goal is to continue weight management plan. She has agreed to: follow the Category 2 plan - 1200 kcal per day, incorporate prepackaged healthy meals for convenience, incorporate 1-2 meal replacements a day for convenience , and continue to work on implementation of reduced calorie nutrition plan (RCNP)  Behavioral Health and Counseling  We discussed the following behavioral modification strategies today: increasing lean protein intake to established goals, decreasing simple carbohydrates , increasing vegetables, avoiding temptations and identifying enticing environmental cues, planning for success, better snacking choices, celebration eating strategies, and getting back on track after recent lapse.  Additional education and resources provided today: None  Recommended Physical Activity Goals  Mandy Whitehead has been advised to work up to 150 minutes of moderate intensity aerobic activity a week and strengthening exercises 2-3 times per week for cardiovascular health, weight  loss maintenance and preservation of muscle mass.  She has agreed to :  Think about enjoyable ways to increase daily physical activity and overcoming barriers to exercise and  Increase physical activity in their day and reduce sedentary time (increase NEAT).  Medical Interventions and Pharmacotherapy  We discussed various medication options to help Mandy Whitehead with her weight loss efforts and we both agreed to : Start anti-obesity medication.  In addition to reduced calorie nutrition plan (RCNP), behavioral strategies and physical activity, Mandy Whitehead would benefit from pharmacotherapy to assist with hunger signals, satiety and cravings. This will reduce obesity-related health risks by inducing weight loss, and help reduce food consumption and adherence to [facility]) . It may also improve QOL by improving self-confidence and reduce the  setbacks associated with metabolic adaptations.  After discussion of treatment options, mechanisms of action, benefits, side effects, contraindications and shared decision making she is agreeable to starting phentermine once a day. Patient also made aware that medication is indicated for long-term management of obesity and the risk of weight regain following discontinuation of treatment and hence the importance of adhering to medical weight loss plan.    We have discussed that long-term use of phentermine for weight management. Patient informed that use beyond 12 weeks is off-label. Reviewed evidence supporting extended use in select patients with regular monitoring. Risks discussed: insomnia, increased heart rate, elevated BP, and potential for dependence. Alternatives include lifestyle interventions, FDA-approved medications (e.g., Wegovy , Zepbound , Qsymia, Contrave)- these are either not covered, cost prohibitive or contraindicated. Medical history reviewed for contraindications.  Patient verbalized understanding and agreed to start phentermine with close monitoring. Plan includes regular follow-up every 4-6 weeks to assess efficacy, side effects, and vitals.  Medication safety: Reviewed common side effects of phentermine, no side effects reported.   Reviewed vitals signs and they are stable Reviewed for contraindications, none present Reviewed state registry for controlled substances and no other controlled substances found Medication will be discontinued if less than 5% weight loss in 6 months Discussed safety data and off label use for long-term treatment of obesity.   Associated Conditions Impacted by Obesity Treatment  Assessment & Plan Class 3 severe obesity with serious comorbidity and body mass index (BMI) of 40.0 to 44.9 in adult, unspecified obesity type (HCC) Pure hypercholesterolemia OSA (obstructive sleep apnea) -mild-PS MG 2025 Insulin  resistance Prediabetes Weight: decrease of 24.4 lb (9.4%) over 11 months, 3 weeks  Start: 04/03/2023 260 lb 6.4 oz (118.1 kg)  End: 03/26/2024 236 lb (107 kg)  Does not have coverage for GLP-1 Did not tolerate metformin  for diabetes prevention Difficulty maintaining weight loss, especially during holidays and family stressors. Insurance denied Wegovy  and Mounjaro . Metformin  not tolerated. No current exercise routine. Discussed phentermine as an appetite suppressant and energy booster. Explained potential side effects including elevated heart rate, blood pressure, insomnia, irritability, and increased anxiety. Emphasized the importance of a 5% weight loss in six months to continue medication. Discussed the need for a controlled medication agreement due to phentermine's status as a controlled substance. - Prescribed Lomira 8 mg, one tablet daily on an empty stomach in the morning. - Sent prescription to [facility]. - Scheduled follow-up in one month. - Advised to avoid alcohol while taking phentermine. - Discussed potential dose adjustment if she experiences adrenergic side effects       Objective   Physical Exam:  Blood pressure 109/74, pulse 76, temperature 98.4 F (36.9 C), height 5' 4 (1.626 m), weight 236 lb (107 kg), SpO2 99%. Body mass index  is 40.51 kg/m.  General: She is  overweight, cooperative, alert, well developed, and in no acute distress. PSYCH: Has normal mood, affect and thought process.   HEENT: EOMI, sclerae are anicteric. Lungs: Normal breathing effort, no conversational dyspnea. Extremities: No edema.  Neurologic: No gross sensory or motor deficits. No tremors or fasciculations noted.    Diagnostic Data Reviewed:  BMET    Component Value Date/Time   NA 135 04/06/2023 1042   K 3.6 04/06/2023 1042   CL 104 04/06/2023 1042   CO2 24 04/06/2023 1042   GLUCOSE 99 04/06/2023 1042   BUN 7 04/06/2023 1042   CREATININE 0.72 04/06/2023 1042   CALCIUM 9.0 04/06/2023 1042   Lab Results  Component Value Date   HGBA1C 6.2 (H) 11/21/2023   HGBA1C 5.9 01/08/2021   Lab Results  Component Value Date   INSULIN  39.1 (H) 11/21/2023   INSULIN  33.4 (H) 04/27/2023   Lab Results  Component Value Date   TSH 2.33 04/06/2023   CBC    Component Value Date/Time   WBC 8.2 04/06/2023 1042   RBC 4.40 04/06/2023 1042   HGB 11.7 (L) 04/06/2023 1042   HCT 36.1 04/06/2023 1042   PLT 297.0 04/06/2023 1042   MCV 81.9 04/06/2023 1042   MCHC 32.4 04/06/2023 1042   RDW 14.7 04/06/2023 1042   Iron Studies    Component Value Date/Time   IRON 92 04/06/2023 1042   TIBC 367 04/06/2023 1042   FERRITIN 24 04/06/2023 1042   IRONPCTSAT 25 04/06/2023 1042   Lipid Panel     Component Value Date/Time   CHOL 196 11/21/2023 1111   TRIG 62 11/21/2023 1111   HDL 63 11/21/2023 1111   CHOLHDL 4 04/06/2023 1042   VLDL 17.0 04/06/2023 1042   LDLCALC 122 (H) 11/21/2023 1111   Hepatic Function Panel     Component Value Date/Time   PROT 7.1 04/06/2023 1042   ALBUMIN 3.7 04/06/2023 1042   AST 17 04/06/2023 1042   ALT 14 04/06/2023 1042   ALKPHOS 79 04/06/2023 1042   BILITOT 0.4 04/06/2023 1042      Component Value Date/Time   TSH 2.33 04/06/2023 1042   Nutritional Lab Results  Component Value Date   VD25OH 47.8 11/21/2023   VD25OH 19.8 (L) 04/27/2023     Medications: Outpatient Encounter Medications as of 03/26/2024  Medication Sig   cloNIDine (CATAPRES) 0.2 MG tablet Take 0.2 mg by mouth at bedtime.   Dapsone 5 % topical gel Apply topically 2 (two) times daily.   Ferrous Sulfate (IRON) 325 (65 Fe) MG TABS Take by mouth.   FLUoxetine (PROZAC) 10 MG capsule Take 10 mg by mouth daily.   fluticasone  (FLONASE ) 50 MCG/ACT nasal spray USE 1 SPRAY IN EACH NOSTRIL DAILY   Magnesium 100 MG CAPS Take by mouth.   Magnesium Glycinate ([employer]S MAGNESIUM GLYCINATE) 100 MG CAPS Take 400 mg by mouth as needed.   Multiple Vitamin (MULTIVITAMIN) capsule Take 1 capsule by mouth daily.   Norgestimate-Ethinyl Estradiol Triphasic 0.18/0.215/0.25 MG-35 MCG tablet Take 1 tablet by mouth daily. From dermatology for Acne   tirzepatide  (MOUNJARO ) 2.5 MG/0.5ML Pen Inject 2.5 mg into the skin once a week.   UNABLE TO FIND daily. Med Name: Nutri Calm Nervous System Support   vitamin C (ASCORBIC ACID) 500 MG tablet Take 1,000 mg by mouth daily.   Vitamin D , Ergocalciferol , (DRISDOL ) 1.25 MG (50000 UNIT) CAPS capsule Take 1 capsule (50,000 Units total) by mouth every 7 (seven) days.  No facility-administered encounter medications on file as of 03/26/2024.     Follow-Up   No follow-ups on file.SABRA She was informed of the importance of frequent follow up visits to maximize her success with intensive lifestyle modifications for her multiple health conditions.  Attestation Statement   Reviewed by clinician on day of visit: allergies, medications, problem list, medical history, surgical history, family history, social history, and previous encounter notes.     Lucas Parker, MD  "

## 2024-03-26 NOTE — Assessment & Plan Note (Signed)
 Weight: decrease of 24.4 lb (9.4%) over 11 months, 3 weeks  Start: 04/03/2023 260 lb 6.4 oz (118.1 kg)  End: 03/26/2024 236 lb (107 kg)  Does not have coverage for GLP-1 Did not tolerate metformin  for diabetes prevention Difficulty maintaining weight loss, especially during holidays and family stressors. Insurance denied Wegovy  and Mounjaro . Metformin  not tolerated. No current exercise routine. Discussed phentermine as an appetite suppressant and energy booster. Explained potential side effects including elevated heart rate, blood pressure, insomnia, irritability, and increased anxiety. Emphasized the importance of a 5% weight loss in six months to continue medication. Discussed the need for a controlled medication agreement due to phentermine's status as a controlled substance. - Prescribed Lomira 8 mg, one tablet daily on an empty stomach in the morning. - Sent prescription to [facility]. - Scheduled follow-up in one month. - Advised to avoid alcohol while taking phentermine. - Discussed potential dose adjustment if she experiences adrenergic side effects

## 2024-04-03 NOTE — Progress Notes (Signed)
 " [facility] [address] [place] [address] [phone]       OFFICE FOLLOW UP NOTE  [name] Date of Birth:  01-Mar-2001 Medical Record Number:  [id number]    Primary neurologist: Dr. Buck Reason for visit: CPAP follow-up  Virtual Visit via Video Note  I connected with Mandy Whitehead on 04/04/2024 at  3:45 PM EST by a video enabled telemedicine application and verified that I am speaking with the correct person using two identifiers.  Location: Patient: at home Provider: in office, [facility]   I discussed the limitations of evaluation and management by telemedicine and the availability of in person appointments. The patient expressed understanding and agreed to proceed.   SUBJECTIVE:    Follow-up visit:  Prior visit: 08/31/2023  Brief HPI:   Mandy Whitehead is a 24 y.o. female who was evaluated by Dr. Buck in 05/2023 for concern of underlying sleep apnea with an underlying medical history of autism spectrum disorder, OCD, ADHD, insulin  resistance, vitamin D  deficiency, prediabetes, hyperlipidemia, and severe obesity with a BMI of over 40, who reported snoring, difficulty maintaining sleep and going to sleep and excessive daytime somnolence.  ESS 5/24. FSS 52/63.  HST 06/2023 showed overall mild sleep apnea with total AHI of 10.5/h and O2 nadir of 84%.  AutoPap set up date 07/25/2023.  At prior initial CPAP visit, compliance report showed excellent usage and optimal residual AHI.  Noted continued improvement with CPAP tolerance.  No significant improvement in regards to sleep related complaints.   Interval history:  Patient returns for CPAP compliance visit via MyChart video visit.  Per compliance report as noted below, she has been using CPAP every other night. She has difficulty tolerating nightly, feels she can sleep better without something on her face.  Currently using nasal pillow, she has not tried any other mask. Working with healthy weight and  wellness, she has lost approximately 15 pounds since initial sleep study, she recently started on phentermine  as insurance denied coverage for GLP-1's.  She has no specific goal weight in mind.  No questions or concerns at this time. DME [facility].         ROS:   14 system review of systems performed and negative with exception of those listed in HPI  PMH: No past medical history on file.  PSH:  Past Surgical History:  Procedure Laterality Date   WISDOM TOOTH EXTRACTION      Social History:  Social History   Socioeconomic History   Marital status: Single    Spouse name: Not on file   Number of children: Not on file   Years of education: Not on file   Highest education level: Not on file  Occupational History   Not on file  Tobacco Use   Smoking status: Never   Smokeless tobacco: Never  Vaping Use   Vaping status: Never Used  Substance and Sexual Activity   Alcohol use: Never   Drug use: Never   Sexual activity: Never  Other Topics Concern   Not on file  Social History Narrative   Not on file   Social Drivers of Health   Tobacco Use: Low Risk (03/26/2024)   Patient History    Smoking Tobacco Use: Never    Smokeless Tobacco Use: Never    Passive Exposure: Not on file  Financial Resource Strain: Not on file  Food Insecurity: Not on file  Transportation Needs: Not on file  Physical Activity: Not on file  Stress:  Not on file  Social Connections: Not on file  Intimate Partner Violence: Not on file  Depression (PHQ2-9): High Risk (04/03/2023)   Depression (PHQ2-9)    PHQ-2 Score: 14  Alcohol Screen: Not on file  Housing: Not on file  Utilities: Not on file  Health Literacy: Not on file    Family History:  Family History  Problem Relation Age of Onset   Miscarriages / Stillbirths Mother    High blood pressure Father    Arthritis Maternal Grandmother    Heart disease Maternal Grandmother    Arthritis Maternal Grandfather    High blood pressure Maternal  Grandfather    Asthma Paternal Grandmother    Diabetes Paternal Grandmother    Heart disease Paternal Grandmother    High blood pressure Paternal Grandmother    High Cholesterol Paternal Grandmother    Kidney disease Paternal Grandmother    Cancer Paternal Grandfather     Medications:   Current Outpatient Medications on File Prior to Visit  Medication Sig Dispense Refill   cloNIDine (CATAPRES) 0.2 MG tablet Take 0.2 mg by mouth at bedtime.     Dapsone 5 % topical gel Apply topically 2 (two) times daily.     Ferrous Sulfate (IRON) 325 (65 Fe) MG TABS Take by mouth.     FLUoxetine (PROZAC) 10 MG capsule Take 10 mg by mouth daily.     fluticasone  (FLONASE ) 50 MCG/ACT nasal spray USE 1 SPRAY IN EACH NOSTRIL DAILY 16 g 5   Magnesium 100 MG CAPS Take by mouth.     Magnesium Glycinate ([employer]S MAGNESIUM GLYCINATE) 100 MG CAPS Take 400 mg by mouth as needed.     Multiple Vitamin (MULTIVITAMIN) capsule Take 1 capsule by mouth daily.     Norgestimate-Ethinyl Estradiol Triphasic 0.18/0.215/0.25 MG-35 MCG tablet Take 1 tablet by mouth daily. From dermatology for Acne     Phentermine  HCl (LOMAIRA ) 8 MG TABS Take 1 tablet (8 mg total) by mouth daily. 28 tablet 0   UNABLE TO FIND daily. Med Name: Nutri Calm Nervous System Support     vitamin C (ASCORBIC ACID) 500 MG tablet Take 1,000 mg by mouth daily.     Vitamin D , Ergocalciferol , (DRISDOL ) 1.25 MG (50000 UNIT) CAPS capsule Take 1 capsule (50,000 Units total) by mouth every 7 (seven) days. 16 capsule 0   No current facility-administered medications on file prior to visit.    Allergies:   Allergies  Allergen Reactions   Metformin  And Related Nausea Only   Penicillin G Itching, Swelling and Hives      OBJECTIVE:  Physical Exam  General: well developed, well nourished, very pleasant African-American female, seated, in no evident distress  Neurologic Exam Mental Status: Awake and fully alert. Oriented to place and time. Recent and remote  memory intact. Attention span, concentration and fund of knowledge appropriate. Mood and affect appropriate.        ASSESSMENT/PLAN: Mandy Whitehead is a 24 y.o. year old female    OSA on CPAP :  Compliance report shows suboptimal usage with using every other night due to difficulty tolerating nasal pillow.  Will request follow-up with DME to try nasal cradle to better tolerance.  Discussed importance of using nightly with greater than 4 hours per night for optimal benefit and can also further aid in weight loss.  If she continues to have difficulty tolerating nightly, can consider use of oral appliance as her apnea is overall mild. Congratulated on current weight loss accomplishments and encouraged continued weight  loss. Consider repeat sleep study after at least 25 lb weight loss or once at goal weight Continue current pressure settings of 5-11 with EPR 3.   Continue to follow with DME company [facility] for any needed supplies or CPAP related concerns      Follow up in 9 months via MyChart video visit or call earlier if needed   CC:  PCP: Jodie Lavern CROME, MD     Harlene Bogaert, AGNP-BC  [facility] [address] [place], [place] [address]  Phone [phone] Fax [phone] Note: This document was prepared with digital dictation and possible smart phrase technology. Any transcriptional errors that result from this process are unintentional.      "

## 2024-04-04 ENCOUNTER — Encounter: Payer: Self-pay | Admitting: Adult Health

## 2024-04-04 ENCOUNTER — Telehealth: Admitting: Adult Health

## 2024-04-04 DIAGNOSIS — G4733 Obstructive sleep apnea (adult) (pediatric): Secondary | ICD-10-CM | POA: Diagnosis not present

## 2024-04-04 NOTE — Progress Notes (Signed)
 SABRA

## 2024-04-30 ENCOUNTER — Ambulatory Visit (INDEPENDENT_AMBULATORY_CARE_PROVIDER_SITE_OTHER): Admitting: Internal Medicine

## 2024-05-21 ENCOUNTER — Ambulatory Visit (INDEPENDENT_AMBULATORY_CARE_PROVIDER_SITE_OTHER): Admitting: Internal Medicine
